# Patient Record
Sex: Female | Born: 1985 | Race: White | Hispanic: No | Marital: Single
Health system: Southern US, Community
[De-identification: ages and names within clinical notes are randomized; demographics above are authoritative.]

## PROBLEM LIST (undated history)

## (undated) DIAGNOSIS — G8929 Other chronic pain: Secondary | ICD-10-CM

## (undated) DIAGNOSIS — N200 Calculus of kidney: Secondary | ICD-10-CM

## (undated) DIAGNOSIS — M6208 Separation of muscle (nontraumatic), other site: Secondary | ICD-10-CM

## (undated) HISTORY — PX: FRACTURE SURGERY: SHX138

## (undated) HISTORY — PX: OTHER SURGICAL HISTORY: SHX169

## (undated) HISTORY — PX: PLACEMENT OF BREAST IMPLANTS: SHX6334

---

## 2014-04-29 ENCOUNTER — Other Ambulatory Visit: Payer: Self-pay | Admitting: Chiropractic Medicine

## 2014-04-29 DIAGNOSIS — G9520 Unspecified cord compression: Secondary | ICD-10-CM

## 2014-05-01 ENCOUNTER — Ambulatory Visit
Admission: RE | Admit: 2014-05-01 | Discharge: 2014-05-01 | Disposition: A | Discharge status: Home / Self Care | Payer: No Typology Code available for payment source | Source: Ambulatory Visit | Attending: Chiropractic Medicine | Admitting: Chiropractic Medicine

## 2014-05-01 DIAGNOSIS — G9520 Unspecified cord compression: Secondary | ICD-10-CM

## 2014-05-05 ENCOUNTER — Other Ambulatory Visit: Payer: Self-pay

## 2018-04-26 ENCOUNTER — Other Ambulatory Visit: Payer: Self-pay | Admitting: Family Medicine

## 2018-04-27 ENCOUNTER — Other Ambulatory Visit: Payer: Self-pay | Admitting: Family Medicine

## 2018-04-27 DIAGNOSIS — M25562 Pain in left knee: Secondary | ICD-10-CM

## 2018-04-28 ENCOUNTER — Ambulatory Visit
Admission: RE | Admit: 2018-04-28 | Discharge: 2018-04-28 | Disposition: A | Discharge status: Home / Self Care | Payer: No Typology Code available for payment source | Source: Ambulatory Visit | Attending: Family Medicine | Admitting: Family Medicine

## 2018-04-28 DIAGNOSIS — M25562 Pain in left knee: Secondary | ICD-10-CM

## 2020-11-19 ENCOUNTER — Other Ambulatory Visit: Payer: Self-pay

## 2020-11-19 ENCOUNTER — Emergency Department (HOSPITAL_BASED_OUTPATIENT_CLINIC_OR_DEPARTMENT_OTHER): Payer: 59

## 2020-11-19 ENCOUNTER — Inpatient Hospital Stay (HOSPITAL_BASED_OUTPATIENT_CLINIC_OR_DEPARTMENT_OTHER)
Admission: EM | Admit: 2020-11-19 | Discharge: 2020-11-21 | DRG: 390 | Disposition: A | Discharge status: Home / Self Care | Payer: 59 | Attending: Internal Medicine | Admitting: Internal Medicine

## 2020-11-19 ENCOUNTER — Encounter (HOSPITAL_BASED_OUTPATIENT_CLINIC_OR_DEPARTMENT_OTHER): Payer: Self-pay | Admitting: *Deleted

## 2020-11-19 DIAGNOSIS — R109 Unspecified abdominal pain: Secondary | ICD-10-CM | POA: Diagnosis present

## 2020-11-19 DIAGNOSIS — K561 Intussusception: Secondary | ICD-10-CM | POA: Diagnosis present

## 2020-11-19 DIAGNOSIS — Z20822 Contact with and (suspected) exposure to covid-19: Secondary | ICD-10-CM | POA: Diagnosis present

## 2020-11-19 DIAGNOSIS — Z881 Allergy status to other antibiotic agents status: Secondary | ICD-10-CM | POA: Diagnosis not present

## 2020-11-19 DIAGNOSIS — R112 Nausea with vomiting, unspecified: Secondary | ICD-10-CM | POA: Diagnosis not present

## 2020-11-19 DIAGNOSIS — R1084 Generalized abdominal pain: Secondary | ICD-10-CM

## 2020-11-19 DIAGNOSIS — Z975 Presence of (intrauterine) contraceptive device: Secondary | ICD-10-CM

## 2020-11-19 DIAGNOSIS — Z9882 Breast implant status: Secondary | ICD-10-CM | POA: Diagnosis not present

## 2020-11-19 DIAGNOSIS — M545 Low back pain, unspecified: Secondary | ICD-10-CM

## 2020-11-19 DIAGNOSIS — D6959 Other secondary thrombocytopenia: Secondary | ICD-10-CM | POA: Diagnosis present

## 2020-11-19 DIAGNOSIS — D72818 Other decreased white blood cell count: Secondary | ICD-10-CM | POA: Diagnosis present

## 2020-11-19 DIAGNOSIS — M549 Dorsalgia, unspecified: Secondary | ICD-10-CM | POA: Diagnosis present

## 2020-11-19 DIAGNOSIS — G8929 Other chronic pain: Secondary | ICD-10-CM | POA: Diagnosis present

## 2020-11-19 DIAGNOSIS — Z800 Family history of malignant neoplasm of digestive organs: Secondary | ICD-10-CM | POA: Diagnosis not present

## 2020-11-19 HISTORY — DX: Other chronic pain: G89.29

## 2020-11-19 HISTORY — DX: Calculus of kidney: N20.0

## 2020-11-19 LAB — CBC WITH DIFFERENTIAL/PLATELET
Abs Immature Granulocytes: 0.01 10*3/uL (ref 0.00–0.07)
Basophils Absolute: 0 10*3/uL (ref 0.0–0.1)
Basophils Relative: 0 %
Eosinophils Absolute: 0 10*3/uL (ref 0.0–0.5)
Eosinophils Relative: 0 %
HCT: 42.7 % (ref 36.0–46.0)
Hemoglobin: 14.2 g/dL (ref 12.0–15.0)
Immature Granulocytes: 0 %
Lymphocytes Relative: 6 %
Lymphs Abs: 0.3 10*3/uL — ABNORMAL LOW (ref 0.7–4.0)
MCH: 30.9 pg (ref 26.0–34.0)
MCHC: 33.3 g/dL (ref 30.0–36.0)
MCV: 92.8 fL (ref 80.0–100.0)
Monocytes Absolute: 0.2 10*3/uL (ref 0.1–1.0)
Monocytes Relative: 4 %
Neutro Abs: 4.7 10*3/uL (ref 1.7–7.7)
Neutrophils Relative %: 90 %
Platelets: 140 10*3/uL — ABNORMAL LOW (ref 150–400)
RBC: 4.6 MIL/uL (ref 3.87–5.11)
RDW: 11.8 % (ref 11.5–15.5)
WBC: 5.3 10*3/uL (ref 4.0–10.5)
nRBC: 0 % (ref 0.0–0.2)

## 2020-11-19 LAB — RESP PANEL BY RT-PCR (FLU A&B, COVID) ARPGX2
Influenza A by PCR: NEGATIVE
Influenza B by PCR: NEGATIVE
SARS Coronavirus 2 by RT PCR: NEGATIVE

## 2020-11-19 LAB — COMPREHENSIVE METABOLIC PANEL
ALT: 20 U/L (ref 0–44)
AST: 20 U/L (ref 15–41)
Albumin: 4.6 g/dL (ref 3.5–5.0)
Alkaline Phosphatase: 52 U/L (ref 38–126)
Anion gap: 6 (ref 5–15)
BUN: 18 mg/dL (ref 6–20)
CO2: 28 mmol/L (ref 22–32)
Calcium: 9 mg/dL (ref 8.9–10.3)
Chloride: 105 mmol/L (ref 98–111)
Creatinine, Ser: 0.96 mg/dL (ref 0.44–1.00)
GFR, Estimated: >60 mL/min (ref >60)
Glucose, Bld: 99 mg/dL (ref 70–99)
Potassium: 4.1 mmol/L (ref 3.5–5.1)
Sodium: 139 mmol/L (ref 135–145)
Total Bilirubin: 0.8 mg/dL (ref 0.3–1.2)
Total Protein: 7.2 g/dL (ref 6.5–8.1)

## 2020-11-19 LAB — URINALYSIS, ROUTINE W REFLEX MICROSCOPIC
Bilirubin Urine: NEGATIVE
Glucose, UA: NEGATIVE mg/dL
Hgb urine dipstick: NEGATIVE
Ketones, ur: NEGATIVE mg/dL
Leukocytes,Ua: NEGATIVE
Nitrite: NEGATIVE
Protein, ur: NEGATIVE mg/dL
Specific Gravity, Urine: 1.02 (ref 1.005–1.030)
pH: 7.5 (ref 5.0–8.0)

## 2020-11-19 LAB — LIPASE, BLOOD: Lipase: 28 U/L (ref 11–51)

## 2020-11-19 LAB — LACTIC ACID, PLASMA: Lactic Acid, Venous: 0.6 mmol/L (ref 0.5–1.9)

## 2020-11-19 LAB — PREGNANCY, URINE: Preg Test, Ur: NEGATIVE

## 2020-11-19 MED ORDER — IOHEXOL 350 MG/ML SOLN
85.0000 mL | Freq: Once | INTRAVENOUS | Status: AC | PRN
  Administered 2020-11-19: 85 mL via INTRAVENOUS

## 2020-11-19 MED ORDER — FAMOTIDINE IN NACL 20-0.9 MG/50ML-% IV SOLN
20.0000 mg | Freq: Once | INTRAVENOUS | Status: AC
  Administered 2020-11-19: 20 mg via INTRAVENOUS
  Filled 2020-11-19: qty 50

## 2020-11-19 MED ORDER — SODIUM CHLORIDE 0.9 % IV BOLUS
1000.0000 mL | Freq: Once | INTRAVENOUS | Status: AC
  Administered 2020-11-19: 1000 mL via INTRAVENOUS

## 2020-11-19 MED ORDER — HYDROMORPHONE HCL 1 MG/ML IJ SOLN
0.5000 mg | INTRAMUSCULAR | Status: DC | PRN
  Administered 2020-11-19: 0.5 mg via INTRAVENOUS
  Filled 2020-11-19 (×3): qty 0.5

## 2020-11-19 MED ORDER — ONDANSETRON HCL 4 MG/2ML IJ SOLN: 4.0000 mg | Freq: Four times a day (QID) | INTRAMUSCULAR | Status: DC | PRN

## 2020-11-19 MED ORDER — ACETAMINOPHEN 650 MG RE SUPP: 650.0000 mg | Freq: Four times a day (QID) | RECTAL | Status: DC | PRN

## 2020-11-19 MED ORDER — ONDANSETRON HCL 4 MG/2ML IJ SOLN
4.0000 mg | Freq: Once | INTRAMUSCULAR | Status: AC
  Administered 2020-11-19: 4 mg via INTRAVENOUS
  Filled 2020-11-19: qty 2

## 2020-11-19 MED ORDER — FENTANYL CITRATE PF 50 MCG/ML IJ SOSY
25.0000 ug | PREFILLED_SYRINGE | Freq: Once | INTRAMUSCULAR | Status: AC
  Administered 2020-11-19: 25 ug via INTRAVENOUS
  Filled 2020-11-19: qty 1

## 2020-11-19 MED ORDER — NALOXONE HCL 0.4 MG/ML IJ SOLN: 0.4000 mg | INTRAMUSCULAR | Status: DC | PRN

## 2020-11-19 MED ORDER — LACTATED RINGERS IV SOLN: INTRAVENOUS | Status: DC

## 2020-11-19 MED ORDER — ACETAMINOPHEN 325 MG PO TABS: 650.0000 mg | ORAL_TABLET | Freq: Four times a day (QID) | ORAL | Status: DC | PRN

## 2020-11-19 NOTE — ED Provider Notes (Signed)
[facility] EMERGENCY DEPARTMENT Provider Note   CSN: [id number] Arrival date & time: 11/19/20  1613     History Chief Complaint  Patient presents with   Back Pain    Sheila Mcclain is a 35 y.o. female presenting to the ED with a chief complaint of back pain, abdominal pain and vomiting.  States that she has a history of chronic back pain and began having lower back pain last night which she attributed to her chronic pain.  When she woke up this morning and was brushing her teeth, she began feeling nauseous and had an episode of nonbloody, nonbilious emesis.  She took 1 dose of Robaxin to help with her abdominal pain which turned into back pain.  This helped briefly but continued to be symptomatic.  She has not had a bowel movement today.  She has had no dysuria, hematuria.  No vaginal or pelvic complaints.  She has a history of kidney stones with last episode being in 2016-2017.  This passed without difficulty.  She was told at the time that she had kidney stones that may cause pain in the future.  She is concerned that this could be related to kidney stones.  Has recently finished antibiotics for BV.  She does not believe that she could be pregnant as she has an IUD. No prior abdominal surgeries.  HPI     Past Medical History:  Diagnosis Date   Chronic back pain    Kidney stones     There are no problems to display for this patient.   Past Surgical History:  Procedure Laterality Date   FRACTURE SURGERY     PLACEMENT OF BREAST IMPLANTS       OB History   No obstetric history on file.     No family history on file.  Social History   Tobacco Use   Smoking status: Never   Smokeless tobacco: Never  Vaping Use   Vaping Use: Never used  Substance Use Topics   Alcohol use: Yes   Drug use: Never    Home Medications Prior to Admission medications   Medication Sig Start Date End Date Taking? Authorizing Provider  methocarbamol (ROBAXIN) 500 MG tablet   04/27/16  Yes [provider]    Allergies    Clindamycin  Review of Systems   Review of Systems  Constitutional:  Negative for appetite change, chills and fever.  HENT:  Negative for ear pain, rhinorrhea, sneezing and sore throat.   Eyes:  Negative for photophobia and visual disturbance.  Respiratory:  Negative for cough, chest tightness, shortness of breath and wheezing.   Cardiovascular:  Negative for chest pain and palpitations.  Gastrointestinal:  Positive for abdominal pain, nausea and vomiting. Negative for blood in stool, constipation and diarrhea.  Genitourinary:  Negative for dysuria, hematuria and urgency.  Musculoskeletal:  Positive for back pain. Negative for myalgias.  Skin:  Negative for rash.  Neurological:  Negative for dizziness, weakness and light-headedness.   Physical Exam Updated Vital Signs BP 113/61   Pulse 93   Temp 99.6 F (37.6 C) (Oral)   Resp 16   Ht 5\' 8"  (1.727 m)   Wt 70.8 kg   LMP 11/12/2020   SpO2 99%   BMI 23.72 kg/m   Physical Exam Vitals and nursing note reviewed.  Constitutional:      General: She is not in acute distress.    Appearance: She is well-developed.  HENT:     Head: Normocephalic  and atraumatic.     Nose: Nose normal.  Eyes:     General: No scleral icterus.       Left eye: No discharge.     Conjunctiva/sclera: Conjunctivae normal.  Cardiovascular:     Rate and Rhythm: Regular rhythm. Tachycardia present.     Heart sounds: Normal heart sounds. No murmur heard.   No friction rub. No gallop.  Pulmonary:     Effort: Pulmonary effort is normal. No respiratory distress.     Breath sounds: Normal breath sounds.  Abdominal:     General: Bowel sounds are normal. There is no distension.     Palpations: Abdomen is soft.     Tenderness: There is abdominal tenderness (Generalized). There is right CVA tenderness and left CVA tenderness. There is no guarding.  Musculoskeletal:        General: Normal range of motion.      Cervical back: Normal range of motion and neck supple.  Skin:    General: Skin is warm and dry.     Findings: No rash.  Neurological:     Mental Status: She is alert.     Motor: No abnormal muscle tone.     Coordination: Coordination normal.    ED Results / Procedures / Treatments   Labs (all labs ordered are listed, but only abnormal results are displayed) Labs Reviewed  CBC WITH DIFFERENTIAL/PLATELET - Abnormal; Notable for the following components:      Result Value   Platelets 140 (*)    Lymphs Abs 0.3 (*)    All other components within normal limits  RESP PANEL BY RT-PCR (FLU A&B, COVID) ARPGX2  COMPREHENSIVE METABOLIC PANEL  URINALYSIS, ROUTINE W REFLEX MICROSCOPIC  PREGNANCY, URINE  LIPASE, BLOOD  LACTIC ACID, PLASMA    EKG None  Radiology CT ABDOMEN PELVIS W CONTRAST  Result Date: 11/19/2020 CLINICAL DATA:  Epigastric pain. Chronic back pain. feels like the pain may be related to a kidney stone EXAM: CT ABDOMEN AND PELVIS WITH CONTRAST TECHNIQUE: Multidetector CT imaging of the abdomen and pelvis was performed using the standard protocol following bolus administration of intravenous contrast. CONTRAST:  33mL OMNIPAQUE IOHEXOL 350 MG/ML SOLN COMPARISON:  None. FINDINGS: Lower chest: Bilateral breast implants partially visualized. No acute abnormality. Hepatobiliary: No focal liver abnormality. No gallstones, gallbladder wall thickening, or pericholecystic fluid. No biliary dilatation. Pancreas: No focal lesion. Normal pancreatic contour. No surrounding inflammatory changes. No main pancreatic ductal dilatation. Spleen: Normal in size without focal abnormality. Adrenals/Urinary Tract: No adrenal nodule bilaterally. Bilateral kidneys enhance symmetrically. Subcentimeter hypodensity within the right kidney is too small to characterize. No nephroureterolithiasis. No nephroureterolithiasis.  No hydronephrosis. No hydroureter. The urinary bladder is unremarkable. Stomach/Bowel:  Stomach is within normal limits. Left upper abdomen intussusception of the proximal small bowel telescoping into the small bowel and measuring approximately 5.5 cm in length (2:41, 5:56). Otherwise no small bowel wall thickening or dilatation. No evidence of large bowel wall thickening or dilatation. The appendix not definitely identified with no right lower quadrant inflammatory changes to suggest acute appendicitis. Vascular/Lymphatic: No abdominal aorta or iliac aneurysm. No abdominal, pelvic, or inguinal lymphadenopathy. Reproductive: A t shaped intrauterine device is noted within the uterus in grossly appropriate position. Otherwise the uterus and bilateral adnexal regions are unremarkable. Other: No intraperitoneal free fluid. No intraperitoneal free gas. No organized fluid collection. Musculoskeletal: No abdominal wall hernia or abnormality. No suspicious lytic or blastic osseous lesions. No acute displaced fracture. Endplate sclerosis L4-L5. IMPRESSION: 1. Left upper  abdomen proximal small bowel into small bowel intussusception measuring approximately 5.5 cm in length. 2. T-shaped intrauterine device in grossly appropriate position. 3. No nephroureterolithiasis. Electronically Signed   By: Tish Frederickson M.D.   On: 11/19/2020 18:21    Procedures Procedures   Medications Ordered in ED Medications  sodium chloride 0.9 % bolus 1,000 mL (0 mLs Intravenous Stopped 11/19/20 1845)  famotidine (PEPCID) IVPB 20 mg premix (0 mg Intravenous Stopped 11/19/20 1749)  ondansetron (ZOFRAN) injection 4 mg (4 mg Intravenous Given 11/19/20 1716)  fentaNYL (SUBLIMAZE) injection 25 mcg (25 mcg Intravenous Given 11/19/20 1719)  iohexol (OMNIPAQUE) 350 MG/ML injection 85 mL (85 mLs Intravenous Contrast Given 11/19/20 1737)  fentaNYL (SUBLIMAZE) injection 25 mcg (25 mcg Intravenous Given 11/19/20 2037)    ED Course  I have reviewed the triage vital signs and the nursing notes.  Pertinent labs & imaging results that were  available during my care of the patient were reviewed by me and considered in my medical decision making (see chart for details).  Clinical Course as of 11/19/20 2102  Thu Nov 19, 2020  1702 Preg Test, Ur: NEGATIVE [HK]  1702 Urinalysis, Routine w reflex microscopic Urine, Clean Catch [HK]  1707 WBC: 5.3 [HK]  1710 Lipase: 28 [HK]  1710 Creatinine: 0.96 [HK]  1807 Patient initially agreed to be admitted to [facility] long.  However states that she would prefer to be transferred to [facility] as she has received previous care there.  We will attempt to transfer and admit there after speaking to their surgery team. [HK]  2016 Lactic Acid, Venous: 0.6 [HK]  2016 No beds available at [facility].  She agrees to admission at [facility].  I admitted to medicine service with general surgery consult. [HK]    Clinical Course User Index [HK] Dietrich Pates, PA-C   MDM Rules/Calculators/A&P                           35 year old female presenting to the ED for abdominal pain, lower back pain and vomiting.  Symptoms began yesterday with back pain but progressed to abdominal pain vomiting today.  Minimal improvement noted with Robaxin.  Reports pain is mostly in her upper abdomen but does report generalized pain as well.  Denies any pelvic or vaginal complaints.  Reports decreased urine and no bowel movement today.  Unsure if she has been passing gas.  On exam patient with tenderness generally with bilateral CVA tenderness.  Urinalysis here is negative.  Pregnancy test is negative.  CBC, CMP unremarkable.  Lipase is normal here.  CT of the abdomen pelvis shows left upper small bowel intussusception.  Lactic acid level is normal.  After consultation with general surgery, Dr. Carolynne Edouard recommends overnight observation for symptom control.  Patient will be transferred to [facility] for admission under hospitalist service.  Her pain is controlled here.    Portions of this note were generated with [employer]. Dictation errors may occur despite best attempts at proofreading.  Final Clinical Impression(s) / ED Diagnoses Final diagnoses:  Intussusception [facility])    Rx / DC Orders ED Discharge Orders     None        Dietrich Pates, PA-C 11/19/20 2102    Maia Plan, MD 11/20/20 1223

## 2020-11-19 NOTE — Plan of Care (Signed)
[facility] will assume care on arrival to accepting facility. Until arrival, care as per EDP. However, [facility] available 24/7 for questions and assistance.  Nursing staff please page [facility] Admits and Consults ([phone]) as soon as the patient arrives the hospital.   

## 2020-11-19 NOTE — ED Notes (Signed)
ED Provider at bedside. 

## 2020-11-19 NOTE — ED Triage Notes (Signed)
Hx of chronic back pain. Lower back pain across her back since yesterday. She feels like the pain may be related to a kidney stone.

## 2020-11-19 NOTE — ED Notes (Signed)
Report called to RN [facility] @ [facility].

## 2020-11-19 NOTE — H&P (Signed)
History and Physical    PLEASE NOTE THAT DRAGON DICTATION SOFTWARE WAS USED IN THE CONSTRUCTION OF THIS NOTE.   Sheila Mcclain ZOX:[id number] DOB: [birth year] DOA: 11/19/2020  PCP: Pcp, No Patient coming from: home/[facility]   I have personally briefly reviewed patient's old medical records in [facility]  Chief Complaint: Abdominal pain  HPI: Sheila Mcclain is a 35 y.o. female with medical history significant for chronic low back pain, kidney stones who is [facility] 11/19/2020 [facility] with proximal small bowel intussusception after presenting from home to the latter facility complaining of abdominal pain.  The patient reports 1 day of abdominal discomfort, noting onset on the evening of 11/18/2020.  Over that time, she conveys sharp abdominal discomfort that is diffuse in distribution but most prominent over the bilateral upper abdominal quadrants, left greater than right.  While initially this discomfort was intermittent, she notes that starting this morning, the pain became constant, worse with palpation over the abdomen.  She has never previously experienced similar abdominal discomfort.  She also notes associated intermittent nausea, resulting in 1 episode of nonbloody, nonbilious emesis that occurred on the morning of 11/19/2020.  Most recent bowel movement occurred on 11/18/2020, noting that the absence of bowel movement over the last 1 day is atypical for her.  Denies any associated or recent melena or hematochezia.  She also notes bilateral low back pain, but conveys that this is chronic in nature, without any significant recent exacerbation thereof.  She specifically conveys her believe that the low back discomfort that she is currently experiencing is separate from her presenting abdominal pain, and denies any associated overt radiation.  Denies any recent trauma or travel.  Denies any associated subjective fever, chills, rigors, or  generalized myalgias.  No recent headache, neck stiffness, shortness of breath, wheezing, cough, diarrhea, or rash.  No recent known COVID-19 exposures.  She denies any acute urinary symptoms, including no dysuria, gross hematuria, or change in urinary urgency/frequency, although she reports that she was recently diagnosed with bacterial vaginosis and underwent biotic treatment for this.  Denies any associated chest pain, palpitations, diaphoresis, dizziness, presyncope, or syncope.  In the setting of intermittent nausea over the course the last day, the patient acknowledges a relative decline in her oral intake over that timeframe.  She reports that she has an IUD in place, but denies any history of laparoscopic or open abdominal surgeries.  No history of bowel obstruction.  She also conveys a history of at least 2 prior kidney stones, with most recent stone occurring 5 to 6 years ago.  She notes that she was able to spontaneously pass her prior kidney stones in the absence of associated need for stent placement or lithotripsy.    [facility]:  Vital signs in the ED were notable for the following: Demonstrates 99.9; heart rate initially 10 8-1 09, which decreased to 98-96 following interval IV fluid administration, as further detailed below; blood pressure 106/63 -120/76; oxygen saturation 95 to 100% room air.   Labs were notable for the following: CMP notable for the following: Bicarbonate 20, creatinine 0.96, glucose 99, and liver enzymes were found to be within normal limits.  Urine pregnancy test negative.  CBC notable for white cell count 5300.  Lactic acid 0.6.  Urinalysis showed no white blood cells, leukocyte Estrace negative, and nitrate negative.  Screening COVID-19 PCR performed in the ED this evening was found to be  negative.  Imaging and additional notable ED work-up: CT abdomen/pelvis with contrast showed left upper abdominal proximal small bowel into small bowel intussusception  measuring approximately 5.5 cm in length, but otherwise showed no evidence of small bowel wall thickening or dilation.  No evidence of bowel perforation or abscess. Additionally, CT abdomen/pelvis showed no evidence of large bowel obstruction or nephro ureterolithiasis, and otherwise showed no evidence of acute intra-abdominal process.  While in the ED, the following were administered: Fentanyl 25 mcg IV x2, Zofran 4 mg IV x1, Pepcid 20 mg IV x1, and a 1 L normal saline bolus.  EDP discussed the patient's case and imaging with the on-call general surgeon, Dr. Carolynne Edouard, who recommended admission to the hospitalist service at [facility] for further management of presenting small bowel intussusception, including overnight symptomatic management, with gen surg to formally consult.      Review of Systems: As per HPI otherwise 10 point review of systems negative.   Past Medical History:  Diagnosis Date   Chronic back pain    Kidney stones     Past Surgical History:  Procedure Laterality Date   FRACTURE SURGERY     PLACEMENT OF BREAST IMPLANTS      Social History:  reports that she has never smoked. She has never used smokeless tobacco. She reports current alcohol use. She reports that she does not use drugs.   Allergies  Allergen Reactions   Clindamycin Diarrhea and Other (See Comments)    Thrombocytopenia Thrombocytopenia     Family history reviewed and not pertinent    Prior to Admission medications   Medication Sig Start Date End Date Taking? Authorizing Provider  methocarbamol (ROBAXIN) 500 MG tablet  04/27/16  Yes [provider]     Objective    Physical Exam: Vitals:   11/19/20 1915 11/19/20 2000 11/19/20 2123 11/19/20 2244  BP:  113/61 119/83 123/74  Pulse: 93 93 88 82  Resp: Temp:   99.9 F (37.7 C) 99.1 F (37.3 C)  TempSrc:   Oral   SpO2: 95% 99% 98% 98%  Weight:      Height:        General: appears to be stated age; alert, oriented Skin:  warm, dry, no rash Head:  AT/[place] Mouth:  Oral mucosa membranes appear dry, normal dentition Neck: supple; trachea midline Heart:  RRR; did not appreciate any M/R/G Lungs: CTAB, did not appreciate any wheezes, rales, or rhonchi Abdomen: + BS; soft, ND, mild diffuse tenderness, most prominent over the bilateral upper abdominal quadrants, but in the absence of any associated guarding, rigidity, or rebound tenderness. Vascular: 2+ pedal pulses b/l; 2+ radial pulses b/l Extremities: no peripheral edema, no muscle wasting Neuro: strength and sensation intact in upper and lower extremities b/l    Labs on Admission: I have personally reviewed following labs and imaging studies  CBC: Recent Labs  Lab 11/19/20 1648  WBC 5.3  NEUTROABS 4.7  HGB 14.2  HCT 42.7  MCV 92.8  PLT 140*   Basic Metabolic Panel: Recent Labs  Lab 11/19/20 1648  NA 139  K 4.1  CL 105  CO2 28  GLUCOSE 99  BUN 18  CREATININE 0.96  CALCIUM 9.0   GFR: Estimated Creatinine Clearance: 84.1 mL/min (by C-G formula based on SCr of 0.96 mg/dL). Liver Function Tests: Recent Labs  Lab 11/19/20 1648  AST 20  ALT 20  ALKPHOS 52  BILITOT 0.8  PROT 7.2  ALBUMIN 4.6  Recent Labs  Lab 11/19/20 1648  LIPASE 28   No results for input(s): AMMONIA in the last 168 hours. Coagulation Profile: No results for input(s): INR, PROTIME in the last 168 hours. Cardiac Enzymes: No results for input(s): CKTOTAL, CKMB, CKMBINDEX, TROPONINI in the last 168 hours. BNP (last 3 results) No results for input(s): PROBNP in the last 8760 hours. HbA1C: No results for input(s): HGBA1C in the last 72 hours. CBG: No results for input(s): GLUCAP in the last 168 hours. Lipid Profile: No results for input(s): CHOL, HDL, LDLCALC, TRIG, CHOLHDL, LDLDIRECT in the last 72 hours. Thyroid Function Tests: No results for input(s): TSH, T4TOTAL, FREET4, T3FREE, THYROIDAB in the last 72 hours. Anemia Panel: No results for input(s):  VITAMINB12, FOLATE, FERRITIN, TIBC, IRON, RETICCTPCT in the last 72 hours. Urine analysis:    Component Value Date/Time   COLORURINE YELLOW 11/19/2020 1648   APPEARANCEUR CLEAR 11/19/2020 1648   LABSPEC 1.020 11/19/2020 1648   PHURINE 7.5 11/19/2020 1648   GLUCOSEU NEGATIVE 11/19/2020 1648   HGBUR NEGATIVE 11/19/2020 1648   BILIRUBINUR NEGATIVE 11/19/2020 1648   KETONESUR NEGATIVE 11/19/2020 1648   PROTEINUR NEGATIVE 11/19/2020 1648   NITRITE NEGATIVE 11/19/2020 1648   LEUKOCYTESUR NEGATIVE 11/19/2020 1648    Radiological Exams on Admission: CT ABDOMEN PELVIS W CONTRAST  Result Date: 11/19/2020 CLINICAL DATA:  Epigastric pain. Chronic back pain. feels like the pain may be related to a kidney stone EXAM: CT ABDOMEN AND PELVIS WITH CONTRAST TECHNIQUE: Multidetector CT imaging of the abdomen and pelvis was performed using the standard protocol following bolus administration of intravenous contrast. CONTRAST:  71mL OMNIPAQUE IOHEXOL 350 MG/ML SOLN COMPARISON:  None. FINDINGS: Lower chest: Bilateral breast implants partially visualized. No acute abnormality. Hepatobiliary: No focal liver abnormality. No gallstones, gallbladder wall thickening, or pericholecystic fluid. No biliary dilatation. Pancreas: No focal lesion. Normal pancreatic contour. No surrounding inflammatory changes. No main pancreatic ductal dilatation. Spleen: Normal in size without focal abnormality. Adrenals/Urinary Tract: No adrenal nodule bilaterally. Bilateral kidneys enhance symmetrically. Subcentimeter hypodensity within the right kidney is too small to characterize. No nephroureterolithiasis. No nephroureterolithiasis.  No hydronephrosis. No hydroureter. The urinary bladder is unremarkable. Stomach/Bowel: Stomach is within normal limits. Left upper abdomen intussusception of the proximal small bowel telescoping into the small bowel and measuring approximately 5.5 cm in length (2:41, 5:56). Otherwise no small bowel wall  thickening or dilatation. No evidence of large bowel wall thickening or dilatation. The appendix not definitely identified with no right lower quadrant inflammatory changes to suggest acute appendicitis. Vascular/Lymphatic: No abdominal aorta or iliac aneurysm. No abdominal, pelvic, or inguinal lymphadenopathy. Reproductive: A t shaped intrauterine device is noted within the uterus in grossly appropriate position. Otherwise the uterus and bilateral adnexal regions are unremarkable. Other: No intraperitoneal free fluid. No intraperitoneal free gas. No organized fluid collection. Musculoskeletal: No abdominal wall hernia or abnormality. No suspicious lytic or blastic osseous lesions. No acute displaced fracture. Endplate sclerosis L4-L5. IMPRESSION: 1. Left upper abdomen proximal small bowel into small bowel intussusception measuring approximately 5.5 cm in length. 2. T-shaped intrauterine device in grossly appropriate position. 3. No nephroureterolithiasis. Electronically Signed   By: Tish Frederickson M.D.   On: 11/19/2020 18:21      Assessment/Plan   Sheila Mcclain is a 35 y.o. female with medical history significant for chronic low back pain, kidney stones who is admitted to [facility] on 11/19/2020 by way of transfer from  [facility] with proximal small bowel intussusception after presenting from home to  the latter facility complaining of abdominal pain.   Principal Problem:   Intussusception (HCC) Active Problems:   Abdominal pain   Nausea & vomiting   Chronic back pain     #) Proximal small bowel intussusception: Diagnosis on the basis of 1 day of diffuse abdominal discomfort, most prominent in the upper abdominal quadrants associated with nausea/vomiting, absence of bowel movement over the course of the last day, and ensuing CT abdomen/pelvis demonstrating evidence of left upper abdominal proximal small bowel to small bowel intussusception without evidence of abscess or perforation or  any other acute intra-abdominal process.  EDP discussed the patient's case and imaging with the on-call general surgeon, Dr. Carolynne Edouard, who recommended admission to the hospitalist service for further management of intussusception, including overnight symptomatic management, with plan for gen surg to formally consult.  Physical exam reveals no evidence of an acute surgical abdomen in the absence of any associated peritoneal signs.  appears nonseptic at this time, and lactate not elevated. Will keep n.p.o. overnight, while pursuing symptomatic management of presenting abdominal discomfort as well as nausea/vomiting, as further detailed below.  Plan: NPO.  Continuous lactated Ringer's at 125 cc/h.  As needed IV Dilaudid.  As needed IV Zofran.  Refraining from pharmacologic DVT prophylaxis.  SCDs.  General surgery consulted, as above.  Repeat CMP and CBC in the morning.  Check INR and serum magnesium level.       #) Abdominal discomfort: 1 day of diffuse abdominal discomfort most prominent in the bilateral upper abdominal quadrants, which appears to be the basis of proximal small bowel intussusception, as further detailed above.  Otherwise, no evidence of acute intra-abdominal process on CT abdomen/pelvis, including no evidence of bowel perforation or abscess. No evidence of peritoneal signs on physical exam.  UA was inconsistent with UTI.   Plan: Further evaluation and management of presenting proximal small bowel intussusception, as above, including.  IV Dilaudid, n.p.o., and general surgery consultation.       #) Nausea/vomiting: Intermittent nausea over the course of the last day, resulting in 1 episode of nonbloody, nonbilious emesis that occurred on the morning of 11/19/2020.  It appears related to her presenting small bowel intussusception, as detailed above.  Urine pregnancy test negative.  No evidence of UTI.  Presenting blood sugar not elevated, with additional labs reflecting no evidence of anion  gap metabolic acidosis.  Plan: Further management presenting small bowel intussusception, as above.  NPO.  Continuous IV fluids, as above.  As needed IV Zofran.  Check serum magnesium level.  Repeat CMP and CBC in the morning.  General surgery consultation is competitive management of small bowel intussusception, as above.       #) Chronic back pain: The patient conveys a history of chronic low back pain, noting the presence of such over the course of the last few days, in similar distribution and intensity relative to her baseline back discomfort.  No preceding trauma.  Lower extremities appear neurovascularly intact, and no evidence of red flag symptoms at this time.  She is on as needed Robaxin as an outpatient.  Plan: Hold as needed naproxen for now in setting of current n.p.o. status.  As needed IV Dilaudid, suspect component of management of presenting small bowel intussusception, as above.  Repeat CBC in the morning.    DVT prophylaxis: scd's  Code Status: Full code Family Communication: none Disposition Plan: Per Rounding Team Consults called: case/imaging discussed with on-call gen surg, Dr. Carolynne Edouard, as further detailed above;  Admission status: Observation; MedSurg     Of note, this patient was added by me to the following Admit List/Treatment Team: [email or web address].      PLEASE NOTE THAT DRAGON DICTATION SOFTWARE WAS USED IN THE CONSTRUCTION OF THIS NOTE.   Angie Fava DO [facility] Pager [phone] From 6PM - 2AM  Otherwise, please contact night-coverage  [email or web address] Password [email or web address]   11/19/2020, 11:14 PM

## 2020-11-20 ENCOUNTER — Inpatient Hospital Stay (HOSPITAL_COMMUNITY): Payer: 59

## 2020-11-20 ENCOUNTER — Encounter (HOSPITAL_COMMUNITY): Payer: Self-pay | Admitting: Internal Medicine

## 2020-11-20 DIAGNOSIS — K561 Intussusception: Principal | ICD-10-CM

## 2020-11-20 LAB — COMPREHENSIVE METABOLIC PANEL
ALT: 15 U/L (ref 0–44)
AST: 14 U/L — ABNORMAL LOW (ref 15–41)
Albumin: 3.6 g/dL (ref 3.5–5.0)
Alkaline Phosphatase: 41 U/L (ref 38–126)
Anion gap: 7 (ref 5–15)
BUN: 13 mg/dL (ref 6–20)
CO2: 26 mmol/L (ref 22–32)
Calcium: 8.4 mg/dL — ABNORMAL LOW (ref 8.9–10.3)
Chloride: 104 mmol/L (ref 98–111)
Creatinine, Ser: 0.93 mg/dL (ref 0.44–1.00)
GFR, Estimated: >60 mL/min (ref >60)
Glucose, Bld: 95 mg/dL (ref 70–99)
Potassium: 3.6 mmol/L (ref 3.5–5.1)
Sodium: 137 mmol/L (ref 135–145)
Total Bilirubin: 0.9 mg/dL (ref 0.3–1.2)
Total Protein: 5.6 g/dL — ABNORMAL LOW (ref 6.5–8.1)

## 2020-11-20 LAB — HIV ANTIBODY (ROUTINE TESTING W REFLEX): HIV Screen 4th Generation wRfx: NONREACTIVE

## 2020-11-20 LAB — CBC
HCT: 35.3 % — ABNORMAL LOW (ref 36.0–46.0)
Hemoglobin: 11.6 g/dL — ABNORMAL LOW (ref 12.0–15.0)
MCH: 30.4 pg (ref 26.0–34.0)
MCHC: 32.9 g/dL (ref 30.0–36.0)
MCV: 92.7 fL (ref 80.0–100.0)
Platelets: 116 10*3/uL — ABNORMAL LOW (ref 150–400)
RBC: 3.81 MIL/uL — ABNORMAL LOW (ref 3.87–5.11)
RDW: 11.9 % (ref 11.5–15.5)
WBC: 3.2 10*3/uL — ABNORMAL LOW (ref 4.0–10.5)
nRBC: 0 % (ref 0.0–0.2)

## 2020-11-20 LAB — MAGNESIUM: Magnesium: 1.7 mg/dL (ref 1.7–2.4)

## 2020-11-20 LAB — PROTIME-INR
INR: 1.2 (ref 0.8–1.2)
Prothrombin Time: 14.8 seconds (ref 11.4–15.2)

## 2020-11-20 MED ORDER — ENOXAPARIN SODIUM 40 MG/0.4ML IJ SOSY: 40.0000 mg | PREFILLED_SYRINGE | INTRAMUSCULAR | Status: DC

## 2020-11-20 MED ORDER — IOHEXOL 9 MG/ML PO SOLN
500.0000 mL | ORAL | Status: AC
  Administered 2020-11-20 (×2): 500 mL via ORAL

## 2020-11-20 NOTE — Consult Note (Signed)
Consult Note  Sheila DubinLauren Taylor Mcclain February 23, 1986  [id number].    Requesting MD: Dr. Lanae Boastamesh Kc Chief Complaint/Reason for Consult: Intussusception  HPI:  Patient is an otherwise healthy 35 year old female who [facility] to [facility] yesterday with abdominal discomfort that started evening of 8/31. Patient reported pain progressed from intermittent sharp crampy pain that was diffuse over upper abdomen to more constant morning of 9/1. She has had associated nausea and one episode of bilious emesis yesterday AM, no diarrhea or dark stools. She reported a normal BM yesterday AM. She also reported fatigue and chills. She was able to pass some flatus yesterday but has not had a BM. Currently reports pain and nausea are much better than yesterday. She has not had any prior abdominal surgery. She does not take any blood thinning medications. She reported an allergy to clindamycin.   ROS: Review of Systems  Constitutional:  Positive for chills and malaise/fatigue. Negative for fever.  Respiratory:  Negative for shortness of breath and wheezing.   Cardiovascular:  Negative for chest pain and palpitations.  Gastrointestinal:  Positive for abdominal pain, nausea and vomiting. Negative for blood in stool, constipation, diarrhea and melena.  Genitourinary:  Negative for dysuria, frequency and urgency.  All other systems reviewed and are negative.  Family History  Problem Relation Age of Onset   Pancreatic cancer Mother     Past Medical History:  Diagnosis Date   Chronic back pain    Kidney stones     Past Surgical History:  Procedure Laterality Date   FRACTURE SURGERY     iud placement     PLACEMENT OF BREAST IMPLANTS      Social History:  reports that she has never smoked. She has never used smokeless tobacco. She reports current alcohol use. She reports that she does not use drugs.  Allergies:  Allergies  Allergen Reactions   Clindamycin Diarrhea and Other (See Comments)     Thrombocytopenia Thrombocytopenia     Medications Prior to Admission  Medication Sig Dispense Refill   tinidazole (TINDAMAX) 500 MG tablet Take 1,000 mg by mouth daily. 5 day supply (Patient not taking: Reported on 11/20/2020)      Blood pressure 109/60, pulse 72, temperature 98.2 F (36.8 C), temperature source Oral, resp. rate 18, height 5\' 8"  (1.727 m), weight 70.8 kg, last menstrual period 11/12/2020, SpO2 100 %. Physical Exam:  General: pleasant, WD, WN female who is laying in bed in NAD HEENT: head is normocephalic, atraumatic.  Sclera are noninjected.  PERRL.  Ears and nose without any masses or lesions.  Mouth is pink and moist Heart: regular, rate, and rhythm.  Normal s1,s2. No obvious murmurs, gallops, or rubs noted.  Palpable radial and pedal pulses bilaterally Lungs: CTAB, no wheezes, rhonchi, or rales noted.  Respiratory effort nonlabored Abd: soft, NT, ND, +BS, diastasis present  MS: all 4 extremities are symmetrical with no cyanosis, clubbing, or edema. Skin: warm and dry with no masses, lesions, or rashes Neuro: Cranial nerves 2-12 grossly intact, sensation is normal throughout Psych: A&Ox3 with an appropriate affect.   Results for orders placed or performed during the hospital encounter of 11/19/20 (from the past 48 hour(s))  Comprehensive metabolic panel     Status: None   Collection Time: 11/19/20  4:48 PM  Result Value Ref Range   Sodium 139 135 - 145 mmol/L   Potassium 4.1 3.5 - 5.1 mmol/L   Chloride 105 98 - 111 mmol/L   CO2  28 22 - 32 mmol/L   Glucose, Bld 99 70 - 99 mg/dL    Comment: Glucose reference range applies only to samples taken after fasting for at least 8 hours.   BUN 18 6 - 20 mg/dL   Creatinine, Ser 4.09 0.44 - 1.00 mg/dL   Calcium 9.0 8.9 - 81.1 mg/dL   Total Protein 7.2 6.5 - 8.1 g/dL   Albumin 4.6 3.5 - 5.0 g/dL   AST 20 15 - 41 U/L   ALT 20 0 - 44 U/L   Alkaline Phosphatase 52 38 - 126 U/L   Total Bilirubin 0.8 0.3 - 1.2 mg/dL   GFR,  Estimated >91 >47 mL/min    Comment: (NOTE) Calculated using the CKD-EPI Creatinine Equation (2021)    Anion gap 6 5 - 15    Comment: Performed at [facility], [address] [place], [place] [address]  CBC with Differential     Status: Abnormal   Collection Time: 11/19/20  4:48 PM  Result Value Ref Range   WBC 5.3 4.0 - 10.5 K/uL   RBC 4.60 3.87 - 5.11 MIL/uL   Hemoglobin 14.2 12.0 - 15.0 g/dL   HCT 21.3 08.6 - 57.8 %   MCV 92.8 80.0 - 100.0 fL   MCH 30.9 26.0 - 34.0 pg   MCHC 33.3 30.0 - 36.0 g/dL   RDW 46.9 62.9 - 52.8 %   Platelets 140 (L) 150 - 400 K/uL   nRBC 0.0 0.0 - 0.2 %   Neutrophils Relative % 90 %   Neutro Abs 4.7 1.7 - 7.7 K/uL   Lymphocytes Relative 6 %   Lymphs Abs 0.3 (L) 0.7 - 4.0 K/uL   Monocytes Relative 4 %   Monocytes Absolute 0.2 0.1 - 1.0 K/uL   Eosinophils Relative 0 %   Eosinophils Absolute 0.0 0.0 - 0.5 K/uL   Basophils Relative 0 %   Basophils Absolute 0.0 0.0 - 0.1 K/uL   Immature Granulocytes 0 %   Abs Immature Granulocytes 0.01 0.00 - 0.07 K/uL    Comment: Performed at [facility], [address] [place], [place] [address]  Urinalysis, Routine w reflex microscopic Urine, Clean Catch     Status: None   Collection Time: 11/19/20  4:48 PM  Result Value Ref Range   Color, Urine YELLOW YELLOW   APPearance CLEAR CLEAR   Specific Gravity, Urine 1.020 1.005 - 1.030   pH 7.5 5.0 - 8.0   Glucose, UA NEGATIVE NEGATIVE mg/dL   Hgb urine dipstick NEGATIVE NEGATIVE   Bilirubin Urine NEGATIVE NEGATIVE   Ketones, ur NEGATIVE NEGATIVE mg/dL   Protein, ur NEGATIVE NEGATIVE mg/dL   Nitrite NEGATIVE NEGATIVE   Leukocytes,Ua NEGATIVE NEGATIVE    Comment: Microscopic not done on urines with negative protein, blood, leukocytes, nitrite, or glucose < 500 mg/dL. Performed at [facility], [address] [place], [place] [address]   Pregnancy, urine     Status: None   Collection Time: 11/19/20  4:48 PM  Result Value Ref  Range   Preg Test, Ur NEGATIVE NEGATIVE    Comment:        THE SENSITIVITY OF THIS METHODOLOGY IS >20 mIU/mL. Performed at [facility], [address] [place], [place] [address]   Lipase, blood     Status: None   Collection Time: 11/19/20  4:48 PM  Result Value Ref Range   Lipase 28 11 - 51 U/L  Comment: Performed at [facility], [address] [place], [place] [address]  Lactic acid, plasma     Status: None   Collection Time: 11/19/20  6:35 PM  Result Value Ref Range   Lactic Acid, Venous 0.6 0.5 - 1.9 mmol/L    Comment: Performed at [facility], [address] [place], [place] [address]  Resp Panel by RT-PCR (Flu A&B, Covid) Nasopharyngeal Swab     Status: None   Collection Time: 11/19/20  7:13 PM   Specimen: Nasopharyngeal Swab; Nasopharyngeal(NP) swabs in vial transport medium  Result Value Ref Range   SARS Coronavirus 2 by RT PCR NEGATIVE NEGATIVE    Comment: (NOTE) SARS-CoV-2 target nucleic acids are NOT DETECTED.  The SARS-CoV-2 RNA is generally detectable in upper respiratory specimens during the acute phase of infection. The lowest concentration of SARS-CoV-2 viral copies this assay can detect is 138 copies/mL. A negative result does not preclude SARS-Cov-2 infection and should not be used as the sole basis for treatment or other patient management decisions. A negative result may occur with  improper specimen collection/handling, submission of specimen other than nasopharyngeal swab, presence of viral mutation(s) within the areas targeted by this assay, and inadequate number of viral copies(<138 copies/mL). A negative result must be combined with clinical observations, patient history, and epidemiological information. The expected result is Negative.  Fact Sheet for Patients:  BloggerCourse.com  Fact Sheet for Healthcare Providers:  SeriousBroker.it  This test is no t yet  approved or cleared by the [place] FDA and  has been authorized for detection and/or diagnosis of SARS-CoV-2 by FDA under an Emergency Use Authorization (EUA). This EUA will remain  in effect (meaning this test can be used) for the duration of the COVID-19 declaration under Section 564(b)(1) of the Act, 21 U.S.C.section 360bbb-3(b)(1), unless the authorization is terminated  or revoked sooner.       Influenza A by PCR NEGATIVE NEGATIVE   Influenza B by PCR NEGATIVE NEGATIVE    Comment: (NOTE) The Xpert Xpress SARS-CoV-2/FLU/RSV plus assay is intended as an aid in the diagnosis of influenza from Nasopharyngeal swab specimens and should not be used as a sole basis for treatment. Nasal washings and aspirates are unacceptable for Xpert Xpress SARS-CoV-2/FLU/RSV testing.  Fact Sheet for Patients: BloggerCourse.com  Fact Sheet for Healthcare Providers: SeriousBroker.it  This test is not yet approved or cleared by the [place] FDA and has been authorized for detection and/or diagnosis of SARS-CoV-2 by FDA under an Emergency Use Authorization (EUA). This EUA will remain in effect (meaning this test can be used) for the duration of the COVID-19 declaration under Section 564(b)(1) of the Act, 21 U.S.C. section 360bbb-3(b)(1), unless the authorization is terminated or revoked.  Performed at [facility], [address], [place], [place] [address]   Magnesium     Status: None   Collection Time: 11/20/20  4:21 AM  Result Value Ref Range   Magnesium 1.7 1.7 - 2.4 mg/dL    Comment: Performed at [facility], [address] [place], [place] [address]  Comprehensive metabolic panel     Status: Abnormal   Collection Time: 11/20/20  4:21 AM  Result Value Ref Range   Sodium 137 135 - 145 mmol/L   Potassium 3.6 3.5 - 5.1 mmol/L   Chloride 104 98 - 111 mmol/L   CO2 26 22 - 32 mmol/L   Glucose, Bld 95  70 - 99 mg/dL  Comment: Glucose reference range applies only to samples taken after fasting for at least 8 hours.   BUN 13 6 - 20 mg/dL   Creatinine, Ser 2.29 0.44 - 1.00 mg/dL   Calcium 8.4 (L) 8.9 - 10.3 mg/dL   Total Protein 5.6 (L) 6.5 - 8.1 g/dL   Albumin 3.6 3.5 - 5.0 g/dL   AST 14 (L) 15 - 41 U/L   ALT 15 0 - 44 U/L   Alkaline Phosphatase 41 38 - 126 U/L   Total Bilirubin 0.9 0.3 - 1.2 mg/dL   GFR, Estimated >79 >89 mL/min    Comment: (NOTE) Calculated using the CKD-EPI Creatinine Equation (2021)    Anion gap 7 5 - 15    Comment: Performed at [facility], [address], [place], [place] [address]  CBC     Status: Abnormal   Collection Time: 11/20/20  4:21 AM  Result Value Ref Range   WBC 3.2 (L) 4.0 - 10.5 K/uL   RBC 3.81 (L) 3.87 - 5.11 MIL/uL   Hemoglobin 11.6 (L) 12.0 - 15.0 g/dL   HCT 17.4 (L) 08.1 - 44.8 %   MCV 92.7 80.0 - 100.0 fL   MCH 30.4 26.0 - 34.0 pg   MCHC 32.9 30.0 - 36.0 g/dL   RDW 18.5 63.1 - 49.7 %   Platelets 116 (L) 150 - 400 K/uL    Comment: SPECIMEN CHECKED FOR CLOTS Immature Platelet Fraction may be clinically indicated, consider ordering this additional test [id number] REPEATED TO VERIFY PLATELET COUNT CONFIRMED BY SMEAR    nRBC 0.0 0.0 - 0.2 %    Comment: Performed at [facility], [address] [place], [place] [address]  Protime-INR     Status: None   Collection Time: 11/20/20  4:21 AM  Result Value Ref Range   Prothrombin Time 14.8 11.4 - 15.2 seconds   INR 1.2 0.8 - 1.2    Comment: (NOTE) INR goal varies based on device and disease states. Performed at [facility], [address], [place], [place] [address]    CT ABDOMEN PELVIS W CONTRAST  Result Date: 11/19/2020 CLINICAL DATA:  Epigastric pain. Chronic back pain. feels like the pain may be related to a kidney stone EXAM: CT ABDOMEN AND PELVIS WITH CONTRAST TECHNIQUE: Multidetector CT imaging of the abdomen and pelvis  was performed using the standard protocol following bolus administration of intravenous contrast. CONTRAST:  35mL OMNIPAQUE IOHEXOL 350 MG/ML SOLN COMPARISON:  None. FINDINGS: Lower chest: Bilateral breast implants partially visualized. No acute abnormality. Hepatobiliary: No focal liver abnormality. No gallstones, gallbladder wall thickening, or pericholecystic fluid. No biliary dilatation. Pancreas: No focal lesion. Normal pancreatic contour. No surrounding inflammatory changes. No main pancreatic ductal dilatation. Spleen: Normal in size without focal abnormality. Adrenals/Urinary Tract: No adrenal nodule bilaterally. Bilateral kidneys enhance symmetrically. Subcentimeter hypodensity within the right kidney is too small to characterize. No nephroureterolithiasis. No nephroureterolithiasis.  No hydronephrosis. No hydroureter. The urinary bladder is unremarkable. Stomach/Bowel: Stomach is within normal limits. Left upper abdomen intussusception of the proximal small bowel telescoping into the small bowel and measuring approximately 5.5 cm in length (2:41, 5:56). Otherwise no small bowel wall thickening or dilatation. No evidence of large bowel wall thickening or dilatation. The appendix not definitely identified with no right lower quadrant inflammatory changes to suggest acute appendicitis. Vascular/Lymphatic: No abdominal aorta or iliac aneurysm. No abdominal, pelvic, or inguinal lymphadenopathy. Reproductive: A t shaped intrauterine device is noted within the uterus in  grossly appropriate position. Otherwise the uterus and bilateral adnexal regions are unremarkable. Other: No intraperitoneal free fluid. No intraperitoneal free gas. No organized fluid collection. Musculoskeletal: No abdominal wall hernia or abnormality. No suspicious lytic or blastic osseous lesions. No acute displaced fracture. Endplate sclerosis L4-L5. IMPRESSION: 1. Left upper abdomen proximal small bowel into small bowel intussusception  measuring approximately 5.5 cm in length. 2. T-shaped intrauterine device in grossly appropriate position. 3. No nephroureterolithiasis. Electronically Signed   By: Tish Frederickson M.D.   On: 11/19/2020 18:21      Assessment/Plan Possible Intussusception  - CT overnight with 5 cm segment intussuscepted proximal small bowel - abdominal pain now significantly improved and patient passing flatus and denies nausea - intussusception could have just been transient, but will get follow up CT with PO contrast to re-evaluate - if there is still evidence of intussusception, will likely need diagnostic laparoscopy  FEN: NPO, IVF  cc/h VTE: SCDs ID: no current abx  Juliet Rude, [facility] Surgery 11/20/2020, 9:47 AM Please see Amion for pager number during day hours 7:00am-4:30pm

## 2020-11-20 NOTE — Progress Notes (Signed)
PROGRESS NOTE    Sheila Mcclain  FGH:[id number] DOB: [birth year] DOA: 11/19/2020 PCP: Pcp, No   Chief Complaint  Patient presents with   Back Pain   Brief Narrative: 35 year old female with chronic low back pain, kidney stone presented to med [facility] with abdominal pain onset 1 day and evening of 8/31st sharp discomfort over bilateral upper abdominal quadrant left greater than right, intermittent nausea and one episode of nonbloody nonbilious vomiting on 9/1.  Last BM 8/31.  She has IUD in place..  Had 2 prior kidney stones. In the ED:CT abdomen showed". Left upper abdomen proximal small bowel into small bowel intussusception measuring approximately 5.5 cm in length. 2. T-shaped intrauterine device in grossly appropriate position".  General surgery consulted, patient was admitted to [facility] long for further management.  Routine blood works fairly stable normal lactic acid.  Subjective: Seen and examined this morning Reports pain is better 3/10 now, last pain meds last night No nausea or Vomiting since admission Feels better overall.  Assessment & Plan:  Intussusception Abdominal pain with nausea and vomiting: Presenting with 1 day history of abdominal pain, CT showed left upper abdomen proximal small bowel into a small bowel intussusception measuring 5.5 centimeters.  Surgery consulted and notified PA, cont conservative management ,n.p.o., IV fluids, pain control.  Discussed with surgery, they are planning to repeat CT abdomen with p.o. contrast.  Mild thrombocytopenia-monitor. Mild leukopenia monitor Chronic back pain: Stable continue home meds History of kidney stone-CT abdomen unremarkable  Diet Order             Diet NPO time specified  Diet effective now                  Patient's Body mass index is 23.72 kg/m. DVT prophylaxis: SCDs Start: 11/19/20 2313 encouraged ambulation, she has agreed. Code Status:   Code Status: Full Code  Family Communication:  plan of care discussed with patient at bedside. Status is: Inpatient Remains inpatient appropriate because:IV treatments appropriate due to intensity of illness or inability to take PO and Inpatient level of care appropriate due to severity of illness Dispo: The patient is from: Home              Anticipated d/c is to: Home              Patient currently is not medically stable to d/c.   Difficult to place patient No Unresulted Labs (From admission, onward)     Start     Ordered   11/20/20 0500  HIV Antibody (routine testing w rflx)  (HIV Antibody (Routine testing w reflex) panel)  Tomorrow morning,   R        11/19/20 2313            Medications reviewed:  Scheduled Meds: Continuous Infusions:  lactated ringers 125 mL/hr at 11/19/20 2330   Consultants:see note  Procedures:see note Antimicrobials: Anti-infectives (From admission, onward)    None      Culture/Microbiology No results found for: SDES, SPECREQUEST, CULT, REPTSTATUS  Other culture-see note  Objective: Vitals: Today's Vitals   11/19/20 2331 11/20/20 0001 11/20/20 0146 11/20/20 0542  BP:   108/68 109/60  Pulse:   70 72  Resp:   18 18  Temp:   98.3 F (36.8 C) 98.2 F (36.8 C)  TempSrc:    Oral  SpO2:   100% 100%  Weight:      Height:      PainSc: 8  Asleep  Intake/Output Summary (Last 24 hours) at 11/20/2020 0843 Last data filed at 11/20/2020 0550 Gross per 24 hour  Intake 2271.4 ml  Output --  Net 2271.4 ml   Filed Weights   11/19/20 1618  Weight: 70.8 kg   Weight change:   Intake/Output from previous day: 09/01 0701 - 09/02 0700 In: 2271.4 [I.V.:789.5; IV Piggyback:1482] Out: -  Intake/Output this shift: No intake/output data recorded. Filed Weights   11/19/20 1618  Weight: 70.8 kg   Examination: General exam: AAO x3 pleasant, NAD HEENT:Oral mucosa moist, Ear/Nose WNL grossly,dentition normal. Respiratory system: bilaterally diminished,no use of accessory muscle, non  tender. Cardiovascular system: S1 & S2 +,No JVD. Gastrointestinal system: Abdomen soft, generalized tenderness more on Left abd, BS++ Nervous System:Alert, awake, moving extremities Extremities: no edema, distal peripheral pulses palpable.  Skin: No rashes,no icterus. MSK: Normal muscle bulk,tone, power.  Data Reviewed: I have personally reviewed following labs and imaging studies CBC: Recent Labs  Lab 11/19/20 1648 11/20/20 0421  WBC 5.3 3.2*  NEUTROABS 4.7  --   HGB 14.2 11.6*  HCT 42.7 35.3*  MCV 92.8 92.7  PLT 140* 116*   Basic Metabolic Panel: Recent Labs  Lab 11/19/20 1648 11/20/20 0421  NA 139 137  K 4.1 3.6  CL 105 104  CO2 28 26  GLUCOSE 99 95  BUN 18 13  CREATININE 0.96 0.93  CALCIUM 9.0 8.4*  MG  --  1.7   GFR: Estimated Creatinine Clearance: 86.8 mL/min (by C-G formula based on SCr of 0.93 mg/dL). Liver Function Tests: Recent Labs  Lab 11/19/20 1648 11/20/20 0421  AST 20 14*  ALT 20 15  ALKPHOS 52 41  BILITOT 0.8 0.9  PROT 7.2 5.6*  ALBUMIN 4.6 3.6   Recent Labs  Lab 11/19/20 1648  LIPASE 28   No results for input(s): AMMONIA in the last 168 hours. Coagulation Profile: Recent Labs  Lab 11/20/20 0421  INR 1.2   Cardiac Enzymes: No results for input(s): CKTOTAL, CKMB, CKMBINDEX, TROPONINI in the last 168 hours. BNP (last 3 results) No results for input(s): PROBNP in the last 8760 hours. HbA1C: No results for input(s): HGBA1C in the last 72 hours. CBG: No results for input(s): GLUCAP in the last 168 hours. Lipid Profile: No results for input(s): CHOL, HDL, LDLCALC, TRIG, CHOLHDL, LDLDIRECT in the last 72 hours. Thyroid Function Tests: No results for input(s): TSH, T4TOTAL, FREET4, T3FREE, THYROIDAB in the last 72 hours. Anemia Panel: No results for input(s): VITAMINB12, FOLATE, FERRITIN, TIBC, IRON, RETICCTPCT in the last 72 hours. Sepsis Labs: Recent Labs  Lab 11/19/20 1835  LATICACIDVEN 0.6    Recent Results (from the past  240 hour(s))  Resp Panel by RT-PCR (Flu A&B, Covid) Nasopharyngeal Swab     Status: None   Collection Time: 11/19/20  7:13 PM   Specimen: Nasopharyngeal Swab; Nasopharyngeal(NP) swabs in vial transport medium  Result Value Ref Range Status   SARS Coronavirus 2 by RT PCR NEGATIVE NEGATIVE Final    Comment: (NOTE) SARS-CoV-2 target nucleic acids are NOT DETECTED.  The SARS-CoV-2 RNA is generally detectable in upper respiratory specimens during the acute phase of infection. The lowest concentration of SARS-CoV-2 viral copies this assay can detect is 138 copies/mL. A negative result does not preclude SARS-Cov-2 infection and should not be used as the sole basis for treatment or other patient management decisions. A negative result may occur with  improper specimen collection/handling, submission of specimen other than nasopharyngeal swab, presence of viral mutation(s) within the  areas targeted by this assay, and inadequate number of viral copies(<138 copies/mL). A negative result must be combined with clinical observations, patient history, and epidemiological information. The expected result is Negative.  Fact Sheet for Patients:  BloggerCourse.comhttps://www.fda.gov/media/152166/download  Fact Sheet for Healthcare Providers:  SeriousBroker.ithttps://www.fda.gov/media/152162/download  This test is no t yet approved or cleared by the [place] FDA and  has been authorized for detection and/or diagnosis of SARS-CoV-2 by FDA under an Emergency Use Authorization (EUA). This EUA will remain  in effect (meaning this test can be used) for the duration of the COVID-19 declaration under Section 564(b)(1) of the Act, 21 U.S.C.section 360bbb-3(b)(1), unless the authorization is terminated  or revoked sooner.       Influenza A by PCR NEGATIVE NEGATIVE Final   Influenza B by PCR NEGATIVE NEGATIVE Final    Comment: (NOTE) The Xpert Xpress SARS-CoV-2/FLU/RSV plus assay is intended as an aid in the diagnosis of influenza  from Nasopharyngeal swab specimens and should not be used as a sole basis for treatment. Nasal washings and aspirates are unacceptable for Xpert Xpress SARS-CoV-2/FLU/RSV testing.  Fact Sheet for Patients: BloggerCourse.comhttps://www.fda.gov/media/152166/download  Fact Sheet for Healthcare Providers: SeriousBroker.ithttps://www.fda.gov/media/152162/download  This test is not yet approved or cleared by the [place] FDA and has been authorized for detection and/or diagnosis of SARS-CoV-2 by FDA under an Emergency Use Authorization (EUA). This EUA will remain in effect (meaning this test can be used) for the duration of the COVID-19 declaration under Section 564(b)(1) of the Act, 21 U.S.C. section 360bbb-3(b)(1), unless the authorization is terminated or revoked.  Performed at [facility], [address] [place], [place] [id number]      Radiology Studies: CT ABDOMEN PELVIS W CONTRAST  Result Date: 11/19/2020 CLINICAL DATA:  Epigastric pain. Chronic back pain. feels like the pain may be related to a kidney stone EXAM: CT ABDOMEN AND PELVIS WITH CONTRAST TECHNIQUE: Multidetector CT imaging of the abdomen and pelvis was performed using the standard protocol following bolus administration of intravenous contrast. CONTRAST:  85mL OMNIPAQUE IOHEXOL 350 MG/ML SOLN COMPARISON:  None. FINDINGS: Lower chest: Bilateral breast implants partially visualized. No acute abnormality. Hepatobiliary: No focal liver abnormality. No gallstones, gallbladder wall thickening, or pericholecystic fluid. No biliary dilatation. Pancreas: No focal lesion. Normal pancreatic contour. No surrounding inflammatory changes. No main pancreatic ductal dilatation. Spleen: Normal in size without focal abnormality. Adrenals/Urinary Tract: No adrenal nodule bilaterally. Bilateral kidneys enhance symmetrically. Subcentimeter hypodensity within the right kidney is too small to characterize. No nephroureterolithiasis. No nephroureterolithiasis.  No  hydronephrosis. No hydroureter. The urinary bladder is unremarkable. Stomach/Bowel: Stomach is within normal limits. Left upper abdomen intussusception of the proximal small bowel telescoping into the small bowel and measuring approximately 5.5 cm in length (2:41, 5:56). Otherwise no small bowel wall thickening or dilatation. No evidence of large bowel wall thickening or dilatation. The appendix not definitely identified with no right lower quadrant inflammatory changes to suggest acute appendicitis. Vascular/Lymphatic: No abdominal aorta or iliac aneurysm. No abdominal, pelvic, or inguinal lymphadenopathy. Reproductive: A t shaped intrauterine device is noted within the uterus in grossly appropriate position. Otherwise the uterus and bilateral adnexal regions are unremarkable. Other: No intraperitoneal free fluid. No intraperitoneal free gas. No organized fluid collection. Musculoskeletal: No abdominal wall hernia or abnormality. No suspicious lytic or blastic osseous lesions. No acute displaced fracture. Endplate sclerosis L4-L5. IMPRESSION: 1. Left upper abdomen proximal small bowel into small bowel intussusception measuring approximately 5.5 cm in length. 2. T-shaped intrauterine device in  grossly appropriate position. 3. No nephroureterolithiasis. Electronically Signed   By: Tish Frederickson M.D.   On: 11/19/2020 18:21     LOS: 1 day   Lanae Boast, MD [facility]  11/20/2020, 8:43 AM

## 2020-11-20 NOTE — Progress Notes (Signed)
Ct scan without evidence of intussusception and symptoms remain resolved. Reviewed with Dr. Cliffton Asters. No surgery indicated. She is cleared for diet from our standpoint. She should follow up with GI outpatient

## 2020-11-21 LAB — CBC
HCT: 35.5 % — ABNORMAL LOW (ref 36.0–46.0)
Hemoglobin: 11.7 g/dL — ABNORMAL LOW (ref 12.0–15.0)
MCH: 30.5 pg (ref 26.0–34.0)
MCHC: 33 g/dL (ref 30.0–36.0)
MCV: 92.7 fL (ref 80.0–100.0)
Platelets: 139 10*3/uL — ABNORMAL LOW (ref 150–400)
RBC: 3.83 MIL/uL — ABNORMAL LOW (ref 3.87–5.11)
RDW: 11.9 % (ref 11.5–15.5)
WBC: 3 10*3/uL — ABNORMAL LOW (ref 4.0–10.5)
nRBC: 0 % (ref 0.0–0.2)

## 2020-11-21 NOTE — Plan of Care (Signed)

## 2020-11-21 NOTE — Progress Notes (Signed)
Assessment unchanged. Pt verbalized understanding of dc instructions. Pt discharged via foot to front entrance.

## 2020-11-21 NOTE — Discharge Summary (Signed)
Physician Discharge Summary  Sheila Mcclain EHO:[id number] DOB: 22-Aug-1985 DOA: 11/19/2020  PCP: Pcp, No  Admit date: 11/19/2020 Discharge date: 11/21/2020  Admitted From: home Disposition:  home  Recommendations for Outpatient Follow-up:  Follow up with PCP in 1-2 weeks Follow-up with gastroenterology  Home Health:none  Equipment/Devices: none  Discharge Condition: Stable Code Status:   Code Status: Full Code Diet recommendation:  Diet Order             DIET SOFT Room service appropriate? Yes; Fluid consistency: Thin  Diet effective now                    Brief/Interim Summary: 35 year old female with chronic low back pain, kidney stone presented to med [facility] with abdominal pain onset 1 day and evening of 8/31st sharp discomfort over bilateral upper abdominal quadrant left greater than right, intermittent nausea and one episode of nonbloody nonbilious vomiting on 9/1.  Last BM 8/31.  She has IUD in place..  Had 2 prior kidney stones. In the ED:CT abdomen showed". Left upper abdomen proximal small bowel into small bowel intussusception measuring approximately 5.5 cm in length. 2. T-shaped intrauterine device in grossly appropriate position".  General surgery consulted, patient was admitted to [facility] long for further management.  Routine blood works fairly stable normal lactic acid Patient is admitted.  Kept NPO.  Surgery was consulted CT abdomen repeated with oral scan no intussusception seen and placed on diet tolerating.  Has had a bowel movement mild soreness.  Suspect transient intussusception that has resolved.  Advised GI follow-up as outpatient number provided  Discharge Diagnoses:   Intussusception Abdominal pain with nausea and vomiting: Resolved.  Repeat CT abdomen normal.  Seen by surgery.  Will need outpatient GI follow-up.  Tolerating diet.  Mild thrombocytopenia-monitor.  Improving.  CBC as outpatient Mild leukopenia monitor CBC as  outpatient Chronic back pain: Stable continue home meds History of kidney stone-CT abdomen unremarkable  Consults: General surgery  Subjective: Alert awake oriented, mild abdominal soreness but resolved.  Has had a bowel movement tolerating diet.  Ate spaghetti last night  Discharge Exam: Vitals:   11/21/20 0101 11/21/20 0525  BP: 106/69 (!) 97/48  Pulse: 61 (!) 57  Resp: 17 18  Temp: 98.1 F (36.7 C) 97.6 F (36.4 C)  SpO2: 99% 99%   General: Pt is alert, awake, not in acute distress Cardiovascular: RRR, S1/S2 +, no rubs, no gallops Respiratory: CTA bilaterally, no wheezing, no rhonchi Abdominal: Soft, NT, ND, bowel sounds + Extremities: no edema, no cyanosis  Discharge Instructions  Discharge Instructions     Discharge instructions   Complete by: As directed    Please follow-up with primary care doctor or community wellness center number provided.  You need to follow-up with gastroenterology, you are provided with [facility] GI office number but can get referral from PCP if needed  Please call call MD or return to ER for similar or worsening recurring problem that brought you to hospital or if any fever,nausea/vomiting,abdominal pain, uncontrolled pain, chest pain,  shortness of breath or any other alarming symptoms.  Please follow-up your doctor as instructed in a week time and call the office for appointment.  Please avoid alcohol, smoking, or any other illicit substance and maintain healthy habits including taking your regular medications as prescribed.  You were cared for by a hospitalist during your hospital stay. If you have any questions about your discharge medications or the care you received while you were  in the hospital after you are discharged, you can call the unit and ask to speak with the hospitalist on call if the hospitalist that took care of you is not available.  Once you are discharged, your primary care physician will handle any further medical issues.  Please note that NO REFILLS for any discharge medications will be authorized once you are discharged, as it is imperative that you return to your primary care physician (or establish a relationship with a primary care physician if you do not have one) for your aftercare needs so that they can reassess your need for medications and monitor your lab values   Increase activity slowly   Complete by: As directed       Allergies as of 11/21/2020       Reactions   Clindamycin Diarrhea, Other (See Comments)   Thrombocytopenia Thrombocytopenia        Medication List     TAKE these medications    tinidazole 500 MG tablet Commonly known as: TINDAMAX Take 1,000 mg by mouth daily. 5 day supply        Follow-up Information     Kerin SalenKarki, Arya, MD. Schedule an appointment as soon as possible for a visit.   Specialty: Gastroenterology Contact information: [address] [place] [address] [phone]         [facility]. Schedule an appointment as soon as possible for a visit.   Contact information: [address] [place] [phone]               Allergies  Allergen Reactions   Clindamycin Diarrhea and Other (See Comments)    Thrombocytopenia Thrombocytopenia     The results of significant diagnostics from this hospitalization (including imaging, microbiology, ancillary and laboratory) are listed below for reference.    Microbiology: Recent Results (from the past 240 hour(s))  Resp Panel by RT-PCR (Flu A&B, Covid) Nasopharyngeal Swab     Status: None   Collection Time: 11/19/20  7:13 PM   Specimen: Nasopharyngeal Swab; Nasopharyngeal(NP) swabs in vial transport medium  Result Value Ref Range Status   SARS Coronavirus 2 by RT PCR NEGATIVE NEGATIVE Final    Comment: (NOTE) SARS-CoV-2 target nucleic acids are NOT DETECTED.  The SARS-CoV-2 RNA is generally detectable in upper  respiratory specimens during the acute phase of infection. The lowest concentration of SARS-CoV-2 viral copies this assay can detect is 138 copies/mL. A negative result does not preclude SARS-Cov-2 infection and should not be used as the sole basis for treatment or other patient management decisions. A negative result may occur with  improper specimen collection/handling, submission of specimen other than nasopharyngeal swab, presence of viral mutation(s) within the areas targeted by this assay, and inadequate number of viral copies(<138 copies/mL). A negative result must be combined with clinical observations, patient history, and epidemiological information. The expected result is Negative.  Fact Sheet for Patients:  BloggerCourse.comhttps://www.fda.gov/media/152166/download  Fact Sheet for Healthcare Providers:  SeriousBroker.ithttps://www.fda.gov/media/152162/download  This test is no t yet approved or cleared by the [place] FDA and  has been authorized for detection and/or diagnosis of SARS-CoV-2 by FDA under an Emergency Use Authorization (EUA). This EUA will remain  in effect (meaning this test can be used) for the duration of the COVID-19 declaration under Section 564(b)(1) of the Act, 21 U.S.C.section 360bbb-3(b)(1), unless the authorization is terminated  or revoked sooner.       Influenza A by PCR NEGATIVE  NEGATIVE Final   Influenza B by PCR NEGATIVE NEGATIVE Final    Comment: (NOTE) The Xpert Xpress SARS-CoV-2/FLU/RSV plus assay is intended as an aid in the diagnosis of influenza from Nasopharyngeal swab specimens and should not be used as a sole basis for treatment. Nasal washings and aspirates are unacceptable for Xpert Xpress SARS-CoV-2/FLU/RSV testing.  Fact Sheet for Patients: BloggerCourse.com  Fact Sheet for Healthcare Providers: SeriousBroker.it  This test is not yet approved or cleared by the [place] FDA and has been  authorized for detection and/or diagnosis of SARS-CoV-2 by FDA under an Emergency Use Authorization (EUA). This EUA will remain in effect (meaning this test can be used) for the duration of the COVID-19 declaration under Section 564(b)(1) of the Act, 21 U.S.C. section 360bbb-3(b)(1), unless the authorization is terminated or revoked.  Performed at [facility], [address] [place], [place] [address]     Procedures/Studies: CT ABDOMEN PELVIS WO CONTRAST  Result Date: 11/20/2020 CLINICAL DATA:  Constant lower abdominal and back pain with vomiting since yesterday. Intussusception suspected. EXAM: CT ABDOMEN AND PELVIS WITHOUT CONTRAST TECHNIQUE: Multidetector CT imaging of the abdomen and pelvis was performed following the standard protocol without IV contrast. COMPARISON:  Abdominopelvic CT 11/19/2020. FINDINGS: Lower chest: Clear lung bases. No significant pleural or pericardial effusion. Bilateral breast implants are partially imaged. Hepatobiliary: The liver appears unremarkable as imaged in the noncontrast state. There is dependent high density within the gallbladder lumen, new from recent CT and attributed to vicarious excretion of contrast. No evidence of gallbladder wall thickening or biliary dilatation. Pancreas: Unremarkable. No pancreatic ductal dilatation or surrounding inflammatory changes. Spleen: Normal in size without focal abnormality. Adrenals/Urinary Tract: Both adrenal glands appear normal. The kidneys appear normal without evidence of urinary tract calculus, suspicious lesion or hydronephrosis. No bladder abnormalities are seen. Stomach/Bowel: Enteric contrast for this examination has passed into the colon. There is no contrast within the stomach or small bowel. The previously demonstrated small bowel intussusception in the left mid abdomen is no longer clearly seen, although assessment is limited by the lack of small bowel enteric contrast and intravenous contrast. No  evidence of bowel wall thickening, distention or surrounding inflammation. The appendix is not clearly visualized. Vascular/Lymphatic: There are no enlarged abdominal or pelvic lymph nodes. No significant vascular findings on noncontrast imaging. Reproductive: Intrauterine device in place.  No adnexal mass. Other: No extravasated enteric contrast, free air or focal extraluminal fluid collection. Musculoskeletal: No acute or significant osseous findings. Asymmetric endplate degenerative changes on the right at L4-5. IMPRESSION: 1. No acute abdominal findings identified. 2. There is no evidence of bowel obstruction or perforation. The previously demonstrated small bowel intussusception is not clearly visualized, although assessment is limited by the lack of intravenous and small-bowel enteric contrast on this study. This may have represented incidental transient intussusception. Electronically Signed   By: Carey Bullocks M.D.   On: 11/20/2020 15:18   CT ABDOMEN PELVIS W CONTRAST  Result Date: 11/19/2020 CLINICAL DATA:  Epigastric pain. Chronic back pain. feels like the pain may be related to a kidney stone EXAM: CT ABDOMEN AND PELVIS WITH CONTRAST TECHNIQUE: Multidetector CT imaging of the abdomen and pelvis was performed using the standard protocol following bolus administration of intravenous contrast. CONTRAST:  85mL OMNIPAQUE IOHEXOL 350 MG/ML SOLN COMPARISON:  None. FINDINGS: Lower chest: Bilateral breast implants partially visualized. No acute abnormality. Hepatobiliary: No focal liver abnormality. No gallstones, gallbladder wall thickening, or pericholecystic fluid. No biliary dilatation. Pancreas: No  focal lesion. Normal pancreatic contour. No surrounding inflammatory changes. No main pancreatic ductal dilatation. Spleen: Normal in size without focal abnormality. Adrenals/Urinary Tract: No adrenal nodule bilaterally. Bilateral kidneys enhance symmetrically. Subcentimeter hypodensity within the right kidney  is too small to characterize. No nephroureterolithiasis. No nephroureterolithiasis.  No hydronephrosis. No hydroureter. The urinary bladder is unremarkable. Stomach/Bowel: Stomach is within normal limits. Left upper abdomen intussusception of the proximal small bowel telescoping into the small bowel and measuring approximately 5.5 cm in length (2:41, 5:56). Otherwise no small bowel wall thickening or dilatation. No evidence of large bowel wall thickening or dilatation. The appendix not definitely identified with no right lower quadrant inflammatory changes to suggest acute appendicitis. Vascular/Lymphatic: No abdominal aorta or iliac aneurysm. No abdominal, pelvic, or inguinal lymphadenopathy. Reproductive: A t shaped intrauterine device is noted within the uterus in grossly appropriate position. Otherwise the uterus and bilateral adnexal regions are unremarkable. Other: No intraperitoneal free fluid. No intraperitoneal free gas. No organized fluid collection. Musculoskeletal: No abdominal wall hernia or abnormality. No suspicious lytic or blastic osseous lesions. No acute displaced fracture. Endplate sclerosis L4-L5. IMPRESSION: 1. Left upper abdomen proximal small bowel into small bowel intussusception measuring approximately 5.5 cm in length. 2. T-shaped intrauterine device in grossly appropriate position. 3. No nephroureterolithiasis. Electronically Signed   By: Tish Frederickson M.D.   On: 11/19/2020 18:21    Labs: BNP (last 3 results) No results for input(s): BNP in the last 8760 hours. Basic Metabolic Panel: Recent Labs  Lab 11/19/20 1648 11/20/20 0421  NA 139 137  K 4.1 3.6  CL 105 104  CO2 28 26  GLUCOSE 99 95  BUN 18 13  CREATININE 0.96 0.93  CALCIUM 9.0 8.4*  MG  --  1.7   Liver Function Tests: Recent Labs  Lab 11/19/20 1648 11/20/20 0421  AST 20 14*  ALT 20 15  ALKPHOS 52 41  BILITOT 0.8 0.9  PROT 7.2 5.6*  ALBUMIN 4.6 3.6   Recent Labs  Lab 11/19/20 1648  LIPASE 28    No results for input(s): AMMONIA in the last 168 hours. CBC: Recent Labs  Lab 11/19/20 1648 11/20/20 0421 11/21/20 0514  WBC 5.3 3.2* 3.0*  NEUTROABS 4.7  --   --   HGB 14.2 11.6* 11.7*  HCT 42.7 35.3* 35.5*  MCV 92.8 92.7 92.7  PLT 140* 116* 139*   Cardiac Enzymes: No results for input(s): CKTOTAL, CKMB, CKMBINDEX, TROPONINI in the last 168 hours. BNP: Invalid input(s): POCBNP CBG: No results for input(s): GLUCAP in the last 168 hours. D-Dimer No results for input(s): DDIMER in the last 72 hours. Hgb A1c No results for input(s): HGBA1C in the last 72 hours. Lipid Profile No results for input(s): CHOL, HDL, LDLCALC, TRIG, CHOLHDL, LDLDIRECT in the last 72 hours. Thyroid function studies No results for input(s): TSH, T4TOTAL, T3FREE, THYROIDAB in the last 72 hours.  Invalid input(s): FREET3 Anemia work up No results for input(s): VITAMINB12, FOLATE, FERRITIN, TIBC, IRON, RETICCTPCT in the last 72 hours. Urinalysis    Component Value Date/Time   COLORURINE YELLOW 11/19/2020 1648   APPEARANCEUR CLEAR 11/19/2020 1648   LABSPEC 1.020 11/19/2020 1648   PHURINE 7.5 11/19/2020 1648   GLUCOSEU NEGATIVE 11/19/2020 1648   HGBUR NEGATIVE 11/19/2020 1648   BILIRUBINUR NEGATIVE 11/19/2020 1648   KETONESUR NEGATIVE 11/19/2020 1648   PROTEINUR NEGATIVE 11/19/2020 1648   NITRITE NEGATIVE 11/19/2020 1648   LEUKOCYTESUR NEGATIVE 11/19/2020 1648   Sepsis Labs Invalid input(s): PROCALCITONIN,  WBC,  LACTICIDVEN  Microbiology Recent Results (from the past 240 hour(s))  Resp Panel by RT-PCR (Flu A&B, Covid) Nasopharyngeal Swab     Status: None   Collection Time: 11/19/20  7:13 PM   Specimen: Nasopharyngeal Swab; Nasopharyngeal(NP) swabs in vial transport medium  Result Value Ref Range Status   SARS Coronavirus 2 by RT PCR NEGATIVE NEGATIVE Final    Comment: (NOTE) SARS-CoV-2 target nucleic acids are NOT DETECTED.  The SARS-CoV-2 RNA is generally detectable in upper  respiratory specimens during the acute phase of infection. The lowest concentration of SARS-CoV-2 viral copies this assay can detect is 138 copies/mL. A negative result does not preclude SARS-Cov-2 infection and should not be used as the sole basis for treatment or other patient management decisions. A negative result may occur with  improper specimen collection/handling, submission of specimen other than nasopharyngeal swab, presence of viral mutation(s) within the areas targeted by this assay, and inadequate number of viral copies(<138 copies/mL). A negative result must be combined with clinical observations, patient history, and epidemiological information. The expected result is Negative.  Fact Sheet for Patients:  BloggerCourse.com  Fact Sheet for Healthcare Providers:  SeriousBroker.it  This test is no t yet approved or cleared by the [place] FDA and  has been authorized for detection and/or diagnosis of SARS-CoV-2 by FDA under an Emergency Use Authorization (EUA). This EUA will remain  in effect (meaning this test can be used) for the duration of the COVID-19 declaration under Section 564(b)(1) of the Act, 21 U.S.C.section 360bbb-3(b)(1), unless the authorization is terminated  or revoked sooner.       Influenza A by PCR NEGATIVE NEGATIVE Final   Influenza B by PCR NEGATIVE NEGATIVE Final    Comment: (NOTE) The Xpert Xpress SARS-CoV-2/FLU/RSV plus assay is intended as an aid in the diagnosis of influenza from Nasopharyngeal swab specimens and should not be used as a sole basis for treatment. Nasal washings and aspirates are unacceptable for Xpert Xpress SARS-CoV-2/FLU/RSV testing.  Fact Sheet for Patients: BloggerCourse.com  Fact Sheet for Healthcare Providers: SeriousBroker.it  This test is not yet approved or cleared by the [place] FDA and has been  authorized for detection and/or diagnosis of SARS-CoV-2 by FDA under an Emergency Use Authorization (EUA). This EUA will remain in effect (meaning this test can be used) for the duration of the COVID-19 declaration under Section 564(b)(1) of the Act, 21 U.S.C. section 360bbb-3(b)(1), unless the authorization is terminated or revoked.  Performed at [facility], [address] [place], [place] [address]      Time coordinating discharge: 25 minutes  SIGNED: Lanae Boast, MD  [facility] 11/21/2020, 8:56 AM  If 7PM-7AM, please contact night-coverage [email or web address]

## 2022-03-20 ENCOUNTER — Other Ambulatory Visit: Payer: Self-pay

## 2022-03-20 ENCOUNTER — Emergency Department (HOSPITAL_BASED_OUTPATIENT_CLINIC_OR_DEPARTMENT_OTHER): Payer: BLUE CROSS/BLUE SHIELD

## 2022-03-20 ENCOUNTER — Encounter (HOSPITAL_BASED_OUTPATIENT_CLINIC_OR_DEPARTMENT_OTHER): Payer: Self-pay

## 2022-03-20 ENCOUNTER — Emergency Department (HOSPITAL_BASED_OUTPATIENT_CLINIC_OR_DEPARTMENT_OTHER)
Admission: EM | Admit: 2022-03-20 | Discharge: 2022-03-20 | Disposition: A | Discharge status: Home / Self Care | Payer: BLUE CROSS/BLUE SHIELD | Attending: Emergency Medicine | Admitting: Emergency Medicine

## 2022-03-20 DIAGNOSIS — O26891 Other specified pregnancy related conditions, first trimester: Secondary | ICD-10-CM | POA: Diagnosis present

## 2022-03-20 DIAGNOSIS — Z3A01 Less than 8 weeks gestation of pregnancy: Secondary | ICD-10-CM

## 2022-03-20 DIAGNOSIS — O0289 Other abnormal products of conception: Secondary | ICD-10-CM | POA: Diagnosis not present

## 2022-03-20 DIAGNOSIS — R1011 Right upper quadrant pain: Secondary | ICD-10-CM | POA: Insufficient documentation

## 2022-03-20 DIAGNOSIS — Z3A08 8 weeks gestation of pregnancy: Secondary | ICD-10-CM | POA: Insufficient documentation

## 2022-03-20 DIAGNOSIS — R1030 Lower abdominal pain, unspecified: Secondary | ICD-10-CM

## 2022-03-20 HISTORY — DX: Separation of muscle (nontraumatic), other site: M62.08

## 2022-03-20 LAB — CBC
HCT: 36.6 % (ref 36.0–46.0)
Hemoglobin: 12.4 g/dL (ref 12.0–15.0)
MCH: 30.4 pg (ref 26.0–34.0)
MCHC: 33.9 g/dL (ref 30.0–36.0)
MCV: 89.7 fL (ref 80.0–100.0)
Platelets: 141 10*3/uL — ABNORMAL LOW (ref 150–400)
RBC: 4.08 MIL/uL (ref 3.87–5.11)
RDW: 11.7 % (ref 11.5–15.5)
WBC: 7.4 10*3/uL (ref 4.0–10.5)
nRBC: 0 % (ref 0.0–0.2)

## 2022-03-20 LAB — COMPREHENSIVE METABOLIC PANEL
ALT: 64 U/L — ABNORMAL HIGH (ref 0–44)
AST: 55 U/L — ABNORMAL HIGH (ref 15–41)
Albumin: 4.1 g/dL (ref 3.5–5.0)
Alkaline Phosphatase: 104 U/L (ref 38–126)
Anion gap: 6 (ref 5–15)
BUN: 9 mg/dL (ref 6–20)
CO2: 25 mmol/L (ref 22–32)
Calcium: 9.4 mg/dL (ref 8.9–10.3)
Chloride: 106 mmol/L (ref 98–111)
Creatinine, Ser: 0.64 mg/dL (ref 0.44–1.00)
GFR, Estimated: >60 mL/min (ref >60)
Glucose, Bld: 106 mg/dL — ABNORMAL HIGH (ref 70–99)
Potassium: 4 mmol/L (ref 3.5–5.1)
Sodium: 137 mmol/L (ref 135–145)
Total Bilirubin: 0.4 mg/dL (ref 0.3–1.2)
Total Protein: 7.3 g/dL (ref 6.5–8.1)

## 2022-03-20 LAB — URINALYSIS, ROUTINE W REFLEX MICROSCOPIC
Bilirubin Urine: NEGATIVE
Glucose, UA: NEGATIVE mg/dL
Hgb urine dipstick: NEGATIVE
Ketones, ur: NEGATIVE mg/dL
Nitrite: NEGATIVE
Protein, ur: NEGATIVE mg/dL
Specific Gravity, Urine: 1.02 (ref 1.005–1.030)
pH: 7 (ref 5.0–8.0)

## 2022-03-20 LAB — PREGNANCY, URINE: Preg Test, Ur: POSITIVE — AB

## 2022-03-20 LAB — URINALYSIS, MICROSCOPIC (REFLEX): RBC / HPF: NONE SEEN RBC/hpf (ref 0–5)

## 2022-03-20 LAB — LIPASE, BLOOD: Lipase: 33 U/L (ref 11–51)

## 2022-03-20 LAB — HCG, QUANTITATIVE, PREGNANCY: hCG, Beta Chain, Quant, S: 227251 m[IU]/mL — ABNORMAL HIGH (ref <5)

## 2022-03-20 NOTE — ED Provider Notes (Signed)
[facility] EMERGENCY DEPARTMENT Provider Note   CSN: [id number] Arrival date & time: 03/20/22  3704     History  Chief Complaint  Patient presents with   Abdominal Pain    Sheila Mcclain is a 36 y.o. female with past medical history significant for intussusception, previous kidney stones who presents with concern for abdominal pain located in right upper abdomen, with radiation to mid, lower abdomen, without vaginal bleeding.  Patient reports that she did workout Thursday, and thought that she may be sore from the working out but that it has been persistent for 4 days.  Patient reports that she was woken up out of sleep because of the pain at 4 AM.  She denies any vaginal bleeding, discharge, dysuria, hematuria.  She denies significant nausea, vomiting.  She denies any previous gallbladder pathology.  She has not taken anything for pain at this time.   Abdominal Pain      Home Medications Prior to Admission medications   Medication Sig Start Date End Date Taking? Authorizing Provider  tinidazole (TINDAMAX) 500 MG tablet Take 1,000 mg by mouth daily. 5 day supply Patient not taking: Reported on 11/20/2020 11/11/20   [provider]      Allergies    Clindamycin    Review of Systems   Review of Systems  Gastrointestinal:  Positive for abdominal pain.  All other systems reviewed and are negative.   Physical Exam Updated Vital Signs BP 116/70   Pulse 92   Temp 97.8 F (36.6 C) (Oral)   Resp 16   Ht 5\' 8"  (1.727 m)   Wt 68 kg   LMP 01/23/2022   SpO2 100%   BMI 22.81 kg/m  Physical Exam Vitals and nursing note reviewed.  Constitutional:      General: She is not in acute distress.    Appearance: Normal appearance.  HENT:     Head: Normocephalic and atraumatic.  Eyes:     General:        Right eye: No discharge.        Left eye: No discharge.  Cardiovascular:     Rate and Rhythm: Normal rate and regular rhythm.     Heart sounds: No  murmur heard.    No friction rub. No gallop.  Pulmonary:     Effort: Pulmonary effort is normal.     Breath sounds: Normal breath sounds.  Abdominal:     General: Bowel sounds are normal.     Palpations: Abdomen is soft.     Comments: Patient with focal tenderness located in the mid abdomen, and lower quadrants, most focally in the right lower quadrant and towards the right upper quadrant but without positive Murphy sign.  No rebound, rigidity, guarding throughout.  Patient too early in pregnancy to appreciate uterine fundus.  Skin:    General: Skin is warm and dry.     Capillary Refill: Capillary refill takes less than 2 seconds.  Neurological:     Mental Status: She is alert and oriented to person, place, and time.  Psychiatric:        Mood and Affect: Mood normal.        Behavior: Behavior normal.     ED Results / Procedures / Treatments   Labs (all labs ordered are listed, but only abnormal results are displayed) Labs Reviewed  COMPREHENSIVE METABOLIC PANEL - Abnormal; Notable for the following components:      Result Value   Glucose, Bld 106 (*)  AST 55 (*)    ALT 64 (*)    All other components within normal limits  CBC - Abnormal; Notable for the following components:   Platelets 141 (*)    All other components within normal limits  URINALYSIS, ROUTINE W REFLEX MICROSCOPIC - Abnormal; Notable for the following components:   APPearance HAZY (*)    Leukocytes,Ua TRACE (*)    All other components within normal limits  PREGNANCY, URINE - Abnormal; Notable for the following components:   Preg Test, Ur POSITIVE (*)    All other components within normal limits  HCG, QUANTITATIVE, PREGNANCY - Abnormal; Notable for the following components:   hCG, Beta Chain, Quant, S 227,251 (*)    All other components within normal limits  URINALYSIS, MICROSCOPIC (REFLEX) - Abnormal; Notable for the following components:   Bacteria, UA MANY (*)    All other components within normal  limits  LIPASE, BLOOD    EKG None  Radiology US OB LESS THAN 14 WEEKS WITH OB TRANSVAGINAL  Result Date: 03/20/2022 CLINICAL DATA:  Pelvic pain in 1st trimester pregnancy. EXAM: OBSTETRIC <14 WK [place] AND TRANSVAGINAL OB US TECHNIQUE: Both transabdominal and transvaginal ultrasound examinations were performed for complete evaluation of the gestation as well as the maternal uterus, adnexal regions, and pelvic cul-de-sac. Transvaginal technique was performed to assess early pregnancy. COMPARISON:  None Available. FINDINGS: Intrauterine gestational sac: Single Yolk sac:  Visualized. Embryo:  Visualized. Cardiac Activity: Visualized. Heart Rate: 175 bpm CRL:  18 mm   8 w   2 d                  [place] EDC: 10/28/2022 Subchorionic hemorrhage:  None visualized. Maternal uterus/adnexae: Both ovaries are normal in appearance. No mass or abnormal free fluid identified. IMPRESSION: Single living IUP with estimated gestational age of [redacted] weeks 2 days, and [place] EDC of 10/28/2022. No maternal uterine or adnexal abnormality identified. Electronically Signed   By: Marlaine Hind M.D.   On: 03/20/2022 10:57    Procedures Procedures    Medications Ordered in ED Medications - No data to display  ED Course/ Medical Decision Making/ A&P                           Medical Decision Making  This patient is a 36 y.o. female  who presents to the ED for concern of generalized abdominal pain for the last 3 to 4 days, patient is notably around [redacted] weeks pregnant..   Differential diagnoses prior to evaluation: The emergent differential diagnosis includes, but is not limited to, ectopic pregnancy, miscarriage, appendicitis, cholecystitis, cholangitis, round ligament pain, UTI, STI, nephrolithiasis, pyelonephritis versus other, also considered molar pregnancy.   This is not an exhaustive differential.   Past Medical History / Co-morbidities: Patient with previous history of intussusception, she has had previous pregnancy, reports  that she was large for gestational age during her whole previous pregnancy, husband is 6\' 7" , reports that she had IUD removed on November 5, had a period for around a week, and has had sexual intercourse on 11/17 and 11/20.  Physical Exam: Physical exam performed. The pertinent findings include: Patient with some generalized tenderness throughout the abdomen without rebound, rigidity, guarding, uterine fundus not palpated, she does have some right-sided abdominal tenderness but no focal Murphy sign on my exam.  She is otherwise well-appearing with stable vital signs.  Lab Tests/Imaging studies: I personally interpreted labs/imaging and the pertinent results  include: CMP with very mild hyperglycemia with nonfasting lab value, glucose 106, she has very subtle elevations of AST, ALT, raise consideration for possible gallbladder pathology, although I think this lab finding along with a normal total bilirubin is likely of no clear significance at this time.  Patient with very mild thrombocytopenia, platelets 141.  Lipase 33.  Her beta-hCG is somewhat elevated at 227, 251, this would be appropriate for around a 6 to 8-week pregnancy, given that she is typically fairly large for gestational age think this is reasonable at this time, especially with no evidence of molar pregnancy on ultrasound.  Ultrasound shows pregnancy which appears around [redacted] weeks gestation, but is likely large for gestational age based on patient's reported history, no evidence of ectopic pregnancy, twins, or other abnormality I agree with the radiologist interpretation   Disposition: After consideration of the diagnostic results and the patients response to treatment, I feel that patient with appropriate pregnancy, no evidence of ectopic, or other abnormalities today, given that she reports that she worked out hard a few days ago suspicious for possible musculoskeletal etiology, but discussed that patient can follow-up as needed if she is  having persistent abdominal pain, mainly other suspicion would be subacute gallbladder pathology, but very low suspicion for acute cholecystitis, cholangitis at this time.   emergency department workup does not suggest an emergent condition requiring admission or immediate intervention beyond what has been performed at this time. The plan is: as above. The patient is safe for discharge and has been instructed to return immediately for worsening symptoms, change in symptoms or any other concerns.  Final Clinical Impression(s) / ED Diagnoses Final diagnoses:  Lower abdominal pain  Less than [redacted] weeks gestation of pregnancy    Rx / DC Orders ED Discharge Orders     None         Anselmo Pickler, PA-C 03/20/22 1138    Kemper Durie, DO 03/20/22 1551

## 2022-03-20 NOTE — ED Triage Notes (Addendum)
Pt reports she is 5 weeks pregnancy. Pain started on Thursday right upper abdomen and now pain is located mid to left lower abdomen. Denies vaginal bleeding. Pt did work out Thursday when the pain started . Severe Pain woke her up at 4 am

## 2022-03-20 NOTE — Discharge Instructions (Signed)
Please use Tylenol for pain.  You may use 1000 mg of Tylenol every 6 hours.  Not to exceed 4 g of Tylenol within 24 hours.  Please follow-up with your OB/GYN for normal ultrasound, if you continue to have significant abdominal pain that is not responding to pain control, rest, I recommend that you follow-up for further evaluation, the only other suspicion that I have although it does not agree with the symptoms you are experiencing today could be some gallbladder pathology, if your abdominal pain seems to be focally in your right upper abdomen and persistent you may want to return for reevaluation of possible gallbladder abnormality.

## 2022-04-18 ENCOUNTER — Encounter (HOSPITAL_BASED_OUTPATIENT_CLINIC_OR_DEPARTMENT_OTHER): Payer: Self-pay | Admitting: Emergency Medicine

## 2022-04-18 ENCOUNTER — Emergency Department (HOSPITAL_BASED_OUTPATIENT_CLINIC_OR_DEPARTMENT_OTHER)
Admission: EM | Admit: 2022-04-18 | Discharge: 2022-04-18 | Disposition: A | Discharge status: Home / Self Care | Payer: BC Managed Care – PPO | Attending: Emergency Medicine | Admitting: Emergency Medicine

## 2022-04-18 ENCOUNTER — Other Ambulatory Visit: Payer: Self-pay

## 2022-04-18 ENCOUNTER — Emergency Department (HOSPITAL_BASED_OUTPATIENT_CLINIC_OR_DEPARTMENT_OTHER): Payer: BC Managed Care – PPO

## 2022-04-18 DIAGNOSIS — O26891 Other specified pregnancy related conditions, first trimester: Secondary | ICD-10-CM | POA: Diagnosis not present

## 2022-04-18 DIAGNOSIS — J111 Influenza due to unidentified influenza virus with other respiratory manifestations: Secondary | ICD-10-CM

## 2022-04-18 DIAGNOSIS — R1030 Lower abdominal pain, unspecified: Secondary | ICD-10-CM | POA: Diagnosis not present

## 2022-04-18 DIAGNOSIS — E876 Hypokalemia: Secondary | ICD-10-CM | POA: Diagnosis not present

## 2022-04-18 DIAGNOSIS — O98511 Other viral diseases complicating pregnancy, first trimester: Secondary | ICD-10-CM | POA: Insufficient documentation

## 2022-04-18 DIAGNOSIS — J101 Influenza due to other identified influenza virus with other respiratory manifestations: Secondary | ICD-10-CM | POA: Diagnosis not present

## 2022-04-18 DIAGNOSIS — R000 Tachycardia, unspecified: Secondary | ICD-10-CM | POA: Insufficient documentation

## 2022-04-18 DIAGNOSIS — Z1152 Encounter for screening for COVID-19: Secondary | ICD-10-CM | POA: Insufficient documentation

## 2022-04-18 DIAGNOSIS — D696 Thrombocytopenia, unspecified: Secondary | ICD-10-CM | POA: Diagnosis not present

## 2022-04-18 DIAGNOSIS — O99111 Other diseases of the blood and blood-forming organs and certain disorders involving the immune mechanism complicating pregnancy, first trimester: Secondary | ICD-10-CM | POA: Insufficient documentation

## 2022-04-18 DIAGNOSIS — Z3A10 10 weeks gestation of pregnancy: Secondary | ICD-10-CM | POA: Insufficient documentation

## 2022-04-18 DIAGNOSIS — R1013 Epigastric pain: Secondary | ICD-10-CM | POA: Diagnosis not present

## 2022-04-18 DIAGNOSIS — O99281 Endocrine, nutritional and metabolic diseases complicating pregnancy, first trimester: Secondary | ICD-10-CM | POA: Diagnosis not present

## 2022-04-18 LAB — URINALYSIS, ROUTINE W REFLEX MICROSCOPIC
Bilirubin Urine: NEGATIVE
Glucose, UA: NEGATIVE mg/dL
Hgb urine dipstick: NEGATIVE
Ketones, ur: NEGATIVE mg/dL
Leukocytes,Ua: NEGATIVE
Nitrite: NEGATIVE
Protein, ur: 30 mg/dL — AB
Specific Gravity, Urine: >=1.03 (ref 1.005–1.030)
pH: 5.5 (ref 5.0–8.0)

## 2022-04-18 LAB — CBC WITH DIFFERENTIAL/PLATELET
Abs Immature Granulocytes: 0.04 10*3/uL (ref 0.00–0.07)
Basophils Absolute: 0 10*3/uL (ref 0.0–0.1)
Basophils Relative: 0 %
Eosinophils Absolute: 0 10*3/uL (ref 0.0–0.5)
Eosinophils Relative: 0 %
HCT: 34.4 % — ABNORMAL LOW (ref 36.0–46.0)
Hemoglobin: 12 g/dL (ref 12.0–15.0)
Immature Granulocytes: 1 %
Lymphocytes Relative: 5 %
Lymphs Abs: 0.3 10*3/uL — ABNORMAL LOW (ref 0.7–4.0)
MCH: 30.2 pg (ref 26.0–34.0)
MCHC: 34.9 g/dL (ref 30.0–36.0)
MCV: 86.4 fL (ref 80.0–100.0)
Monocytes Absolute: 0.4 10*3/uL (ref 0.1–1.0)
Monocytes Relative: 7 %
Neutro Abs: 4.3 10*3/uL (ref 1.7–7.7)
Neutrophils Relative %: 87 %
Platelets: 105 10*3/uL — ABNORMAL LOW (ref 150–400)
RBC: 3.98 MIL/uL (ref 3.87–5.11)
RDW: 12.1 % (ref 11.5–15.5)
WBC: 4.9 10*3/uL (ref 4.0–10.5)
nRBC: 0 % (ref 0.0–0.2)

## 2022-04-18 LAB — COMPREHENSIVE METABOLIC PANEL
ALT: 33 U/L (ref 0–44)
AST: 33 U/L (ref 15–41)
Albumin: 4 g/dL (ref 3.5–5.0)
Alkaline Phosphatase: 75 U/L (ref 38–126)
Anion gap: 8 (ref 5–15)
BUN: 10 mg/dL (ref 6–20)
CO2: 20 mmol/L — ABNORMAL LOW (ref 22–32)
Calcium: 9 mg/dL (ref 8.9–10.3)
Chloride: 104 mmol/L (ref 98–111)
Creatinine, Ser: 0.63 mg/dL (ref 0.44–1.00)
GFR, Estimated: >60 mL/min (ref >60)
Glucose, Bld: 128 mg/dL — ABNORMAL HIGH (ref 70–99)
Potassium: 3.4 mmol/L — ABNORMAL LOW (ref 3.5–5.1)
Sodium: 132 mmol/L — ABNORMAL LOW (ref 135–145)
Total Bilirubin: 0.2 mg/dL — ABNORMAL LOW (ref 0.3–1.2)
Total Protein: 6.9 g/dL (ref 6.5–8.1)

## 2022-04-18 LAB — RESP PANEL BY RT-PCR (RSV, FLU A&B, COVID)  RVPGX2
Influenza A by PCR: POSITIVE — AB
Influenza B by PCR: NEGATIVE
Resp Syncytial Virus by PCR: NEGATIVE
SARS Coronavirus 2 by RT PCR: NEGATIVE

## 2022-04-18 LAB — HCG, QUANTITATIVE, PREGNANCY: hCG, Beta Chain, Quant, S: 108534 m[IU]/mL — ABNORMAL HIGH (ref <5)

## 2022-04-18 LAB — URINALYSIS, MICROSCOPIC (REFLEX)

## 2022-04-18 LAB — LIPASE, BLOOD: Lipase: 27 U/L (ref 11–51)

## 2022-04-18 MED ORDER — LACTATED RINGERS IV BOLUS
1000.0000 mL | Freq: Once | INTRAVENOUS | Status: AC
  Administered 2022-04-18: 1000 mL via INTRAVENOUS

## 2022-04-18 MED ORDER — ACETAMINOPHEN 500 MG PO TABS
1000.0000 mg | ORAL_TABLET | Freq: Once | ORAL | Status: AC
  Administered 2022-04-18: 1000 mg via ORAL
  Filled 2022-04-18: qty 2

## 2022-04-18 MED ORDER — POTASSIUM CHLORIDE CRYS ER 20 MEQ PO TBCR
20.0000 meq | EXTENDED_RELEASE_TABLET | Freq: Once | ORAL | Status: AC
  Administered 2022-04-18: 20 meq via ORAL
  Filled 2022-04-18: qty 1

## 2022-04-18 MED ORDER — OSELTAMIVIR PHOSPHATE 75 MG PO CAPS: 75.0000 mg | ORAL_CAPSULE | Freq: Two times a day (BID) | ORAL | 0 refills | Status: AC

## 2022-04-18 MED ORDER — DOXYLAMINE-PYRIDOXINE 10-10 MG PO TBEC: 1.0000 | DELAYED_RELEASE_TABLET | Freq: Two times a day (BID) | ORAL | 0 refills | Status: AC | PRN

## 2022-04-18 NOTE — ED Triage Notes (Signed)
Recurrent abdominal pain , LUQ , nausea , no emesis , migraine headache . [redacted] weeks pregnant .

## 2022-04-18 NOTE — ED Notes (Signed)
Abd pain since Saturday, some constipation, vomited x 1, child sick at home as well, poor appetite. 10 weeks preg (G2P1), also has HA.

## 2022-04-18 NOTE — Discharge Instructions (Addendum)
You were seen in there ER today for evaluation of your cough and cold symptoms.  You tested positive for influenza A.  I am glad you are feeling better after the Tylenol and fluids.  Please make sure you are taking Tylenol 1000 mg every 6 hours as needed for fever, headache, and bodyaches.  Please make sure you are staying well-hydrated using plenty of fluids, mainly water.  I am sending you home on 2 medications.  One of the medications and antiviral medication for the flu called Tamiflu.  Please take as directed.  This is recommended in pregnancies to help prevent adverse side effects from the flu.  Additionally, sending home with some Diclegis which will help with your nausea.  You take that up to twice daily as needed.  If your insurance does not cover this, you can pick up some Unisom over the counter. Additionally, you had some mild protein in urine which likely thing is from your dehydration however I would mention this to your next OB/GYN appointment.  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  I included information for hematologist into the discharge paperwork.  You can call them to schedule appointment as needed.  If any concerns, new or worsening symptoms, please turn to the nearest Emergency Department for reevaluation. Contact a health care provider if: You have a fever or chills. You have a cough, sore throat, or stuffy nose. You have worsening or unusual muscle aches, headache, tiredness, or loss of appetite. You have vomiting or diarrhea. Get help right away if: You have trouble breathing. You have chest pain. You have abdominal pain. You begin to have labor pains. You do not feel your baby move. You have diarrhea or vomiting that will not go away. You have dizziness or confusion. Your symptoms do not improve, even with treatment. These symptoms may represent a serious problem that is an emergency. Do not wait to see if the symptoms  will go away. Get medical help right away. Call your local emergency services (911 in the [place] Do not drive yourself to the hospital.

## 2022-04-18 NOTE — ED Provider Notes (Addendum)
[place] EMERGENCY DEPARTMENT AT [facility] Provider Note   CSN: [id number] Arrival date & time: 04/18/22  0865     History  Chief Complaint  Patient presents with   Abdominal Pain    Sheila Mcclain is a 37 y.o. female G5P2 [redacted] weeks pregnant with h/o diastases recti, kidney stones, intussusception presents the emergency room today for evaluation of cough and cold symptoms with a headache since yesterday.  She reports that she has been having some epigastric and suprapubic pain but this is concurrent with her pain that she says is the beginning of pregnancy which she links to her diastases recti.  She has not had any vaginal discharge, vaginal bleeding, dysuria, hematuria.  She reports that she is having some urinary frequency but she has had that throughout pregnancy.  She reports that she started to have a cough and cold symptoms yesterday.  Her daughter was recently sick last week.  She is having some nausea with 1 episode of vomiting but is mainly dry heaving.  She has had nausea throughout her pregnancy as well.  She had some constipation.  Reports a cough for the past 3 days with a runny nose, but mainly the body aches with a runny nose started yesterday.  No chest pain or shortness of breath.  Denies any neck stiffness.  She reports that she has a headache that feels like a typical headache for her.  She does not know if she had a fever as she has not been taking her temperature.  She has not taking medications for her symptoms.  She takes a daily prenatal vitamin.  She started clindamycin.  Denies any tobacco, EtOH, also drug use ever.   Abdominal Pain Associated symptoms: constipation, cough, fatigue, nausea and vomiting   Associated symptoms: no chest pain, no chills, no diarrhea, no dysuria, no fever, no hematuria, no shortness of breath, no sore throat, no vaginal bleeding and no vaginal discharge        Home Medications Prior to Admission medications    Medication Sig Start Date End Date Taking? Authorizing Provider  tinidazole (TINDAMAX) 500 MG tablet Take 1,000 mg by mouth daily. 5 day supply Patient not taking: Reported on 11/20/2020 11/11/20   [provider]      Allergies    Clindamycin    Review of Systems   Review of Systems  Constitutional:  Positive for appetite change and fatigue. Negative for chills and fever.  HENT:  Positive for congestion and rhinorrhea. Negative for sore throat.   Respiratory:  Positive for cough. Negative for shortness of breath.   Cardiovascular:  Negative for chest pain.  Gastrointestinal:  Positive for abdominal pain, constipation, nausea and vomiting. Negative for diarrhea.  Genitourinary:  Positive for frequency. Negative for dysuria, hematuria, vaginal bleeding, vaginal discharge and vaginal pain.  Musculoskeletal:  Positive for myalgias. Negative for neck pain.  Neurological:  Positive for headaches. Negative for syncope, light-headedness and numbness.    Physical Exam Updated Vital Signs BP 103/67   Pulse (!) 101   Temp 98.8 F (37.1 C) (Oral)   Resp 18   Wt 69.9 kg   LMP 01/23/2022   SpO2 96%   BMI 23.42 kg/m  Physical Exam Vitals and nursing note reviewed.  Constitutional:      Appearance: Normal appearance. She is not toxic-appearing.     Comments: Uncomfortable, but nontoxic-appearing  HENT:     Head: Normocephalic and atraumatic.     Mouth/Throat:  Mouth: Mucous membranes are moist.  Eyes:     General: No scleral icterus. Cardiovascular:     Rate and Rhythm: Regular rhythm. Tachycardia present.     Comments: Mild tachycardia Pulmonary:     Effort: Pulmonary effort is normal. No respiratory distress.     Breath sounds: Normal breath sounds.     Comments: Lung sounds are clear to auscultation bilaterally.  She speaking in full sentences with ease and is satting well on room air without any increased work of breathing. Abdominal:     General: Abdomen is flat.  Bowel sounds are normal.     Palpations: Abdomen is soft.     Tenderness: There is abdominal tenderness in the epigastric area and suprapubic area. There is no right CVA tenderness, left CVA tenderness, guarding or rebound. Negative signs include Murphy's sign.  Genitourinary:    Comments: Patient declined Musculoskeletal:        General: No deformity.     Cervical back: Normal range of motion.  Skin:    General: Skin is warm and dry.  Neurological:     General: No focal deficit present.     Mental Status: She is alert. Mental status is at baseline.     GCS: GCS eye subscore is 4. GCS verbal subscore is 5. GCS motor subscore is 6.     Cranial Nerves: No cranial nerve deficit, dysarthria or facial asymmetry.     Sensory: No sensory deficit.     Motor: No weakness.     ED Results / Procedures / Treatments   Labs (all labs ordered are listed, but only abnormal results are displayed) Labs Reviewed  RESP PANEL BY RT-PCR (RSV, FLU A&B, COVID)  RVPGX2 - Abnormal; Notable for the following components:      Result Value   Influenza A by PCR POSITIVE (*)    All other components within normal limits  CBC WITH DIFFERENTIAL/PLATELET - Abnormal; Notable for the following components:   HCT 34.4 (*)    Platelets 105 (*)    Lymphs Abs 0.3 (*)    All other components within normal limits  HCG, QUANTITATIVE, PREGNANCY - Abnormal; Notable for the following components:   hCG, Beta Chain, Quant, S 108,534 (*)    All other components within normal limits  COMPREHENSIVE METABOLIC PANEL - Abnormal; Notable for the following components:   Sodium 132 (*)    Potassium 3.4 (*)    CO2 20 (*)    Glucose, Bld 128 (*)    Total Bilirubin 0.2 (*)    All other components within normal limits  URINALYSIS, ROUTINE W REFLEX MICROSCOPIC - Abnormal; Notable for the following components:   Protein, ur 30 (*)    All other components within normal limits  URINALYSIS, MICROSCOPIC (REFLEX) - Abnormal; Notable for  the following components:   Bacteria, UA FEW (*)    All other components within normal limits  URINE CULTURE  LIPASE, BLOOD    EKG None  Radiology US OB Comp < 14 Wks  Result Date: 04/18/2022 CLINICAL DATA:  Abdominal pain in pregnancy EXAM: OBSTETRIC <14 WK ULTRASOUND TECHNIQUE: Transabdominal ultrasound was performed for evaluation of the gestation as well as the maternal uterus and adnexal regions. COMPARISON:  Obstetric ultrasound dated 03/20/2022 FINDINGS: Intrauterine gestational sac: Single Yolk sac:  Visualized. Embryo:  Visualized. Cardiac Activity: Visualized. Heart Rate: 171 bpm MSD: N/A CRL:   60.0 mm   12 w 3 d  [place] EDC: 10/28/2022 Subchorionic hemorrhage: Small hypoechoic focus along the posteroinferior subchorionic region may reflect a small subchorionic hemorrhage. Maternal uterus/adnexae: No adnexal masses.  Ovaries are not seen. IMPRESSION: 1. Viable intrauterine pregnancy at 12 weeks 3 days by crown-rump length with appropriate interval growth. 2. Possible small subchorionic hemorrhage. Electronically Signed   By: Agustin Cree M.D.   On: 04/18/2022 11:23    Procedures Procedures   Medications Ordered in ED Medications  lactated ringers bolus 1,000 mL (0 mLs Intravenous Stopped 04/18/22 1041)  acetaminophen (TYLENOL) tablet 1,000 mg (1,000 mg Oral Given 04/18/22 1002)    ED Course/ Medical Decision Making/ A&P                            Medical Decision Making Amount and/or Complexity of Data Reviewed Labs: ordered. Radiology: ordered.  Risk OTC drugs. Prescription drug management.   37 year old female presents emergency room today for evaluation of cough and cold symptoms with some upper and lower abdominal pain reports this has been her chronic abdominal pain in pregnancy.  Differential diagnosis includes was limited to COVID, flu, RSV, viral illness, electrolyte abnormality, dehydration, gastritis, UTI, diastases recti, intrauterine etiology,  threatened abortion, abdominal pain in pregnancy.  Vital signs initially showed patient's blood pressure normal with an slightly elevated temperature at 100.4 mildly tachycardic at 101, satting well on room air without increased work of breathing.  The patient was concerned as she has had 3 previous miscarriages around this timeframe and was concerned given that she still having the upper and lower abdominal pain and is requesting an ultrasound.  I think this is reasonable.  I independently reviewed and interpreted the patient's labs.  CMP shows mild decrease sodium 132, mildly decreased potassium 3.4.  Elevated glucose at 128 however bicarb is mildly decreased at 20 with a normal gap however.  No LFT or other electrolyte abnormalities.  Lipase within normal limits.  CBC without cytosis or anemia.  It appears her platelets are at 105 which is consistent with her history of thrombocytopenia.  It appears they were as low as 116 last year.  Urinalysis does show some protein in the urine, however her concentration is elevated, could be in the setting of dehydration.  Will have her follow-up with her OB/GYN on this for retest.  It does show few bacteria with 6-10 squames but no white blood cells or nitrates or leukocytes.  She is not having any dysuria symptoms.  I will send her urine out for culture given that she is pregnant however given her chronic epigastric suprapubic tenderness that is unchanged as well as no other urinary symptoms, do not think any empiric treatment is needed at this time.  Lipase is normal.  Respiratory panel is positive for flu A.  US shows IMPRESSION: 1. Viable intrauterine pregnancy at 12 weeks 3 days by crown-rump length with appropriate interval growth. 2. Possible small subchorionic hemorrhage.   Patient's hCG has down trended significantly from 227,000 4 weeks prior to 108,000 now. I consulted OBGYN and spoke with Dr. Adrian Blackwater, he reports that this can be typical in the 10-12w  gestation as the placenta is growing and becoming more attached. Given the patient's [place] results, this should not be concerning. I appreciated his consultation.   After Tylenol and fluids, patient tachycardia has improved with a rate of 94 however her fever is gone down to 98.8.  All other vitals still stable.  Patient reports that she  is feeling much better and her headache and bodyaches have improved significantly.  I reviewed the labs and imaging with the patient and partner at bedside.  We discussed the ultrasound as well as her lab findings for flu, mildly decreased potassium, and her thrombocytopenia.  She is asking more about her thrombocytopenia she has been told this before.  She would like additional follow-up on this which I think is reasonable.  Will send her to Dr. Marin Olp with hematology as he is on-call today.  Additionally, her potassium was replenished today as is mildly decreased.  She has been able to tolerate p.o. while here.  Considered admission however patient is tolerating p.o., tachycardia and fever have improved as well as the patient's feeling better.  I think she stable for discharge home with close OB follow-up.  We discussed return precautions and red flag symptoms.  Discussed do not take ibuprofen as this is dangerous in the first trimester pregnancy.  The patient verbalized understanding agrees the plan.  Patient is stable being discharged home in good condition.  I discussed this case with my attending physician who cosigned this note including patient's presenting symptoms, physical exam, and planned diagnostics and interventions. Attending physician stated agreement with plan or made changes to plan which were implemented.  Final Clinical Impression(s) / ED Diagnoses Final diagnoses:  Flu  Thrombocytopenia ([place])  Abdominal pain during pregnancy in first trimester  Hypokalemia    Rx / DC Orders ED Discharge Orders          Ordered    oseltamivir (TAMIFLU) 75 MG  capsule  Every 12 hours        04/18/22 1227    Doxylamine-Pyridoxine (DICLEGIS) 10-10 MG TBEC  2 times daily PRN        04/18/22 1227              Sherrell Puller, PA-C 04/18/22 1232    Sherrell Puller, PA-C 04/18/22 Leakey, Whitney, DO 04/18/22 1240

## 2022-04-19 LAB — URINE CULTURE: Culture: NO GROWTH

## 2022-07-23 IMAGING — CT CT ABD-PELV W/ CM
2 of 4 series · 15 of 46 positions shown, 17 images · IV contrast (Omnipaque)
Comparison: None.

CLINICAL DATA: Epigastric pain. Chronic back pain. feels like the
pain may be related to a kidney stone

EXAM:
CT ABDOMEN AND PELVIS WITH CONTRAST
TECHNIQUE: Multidetector CT imaging of the abdomen and pelvis was performed
using the standard protocol following bolus administration of
intravenous contrast.
CONTRAST:  85mL OMNIPAQUE IOHEXOL 350 MG/ML SOLN

[Series 2: axial st · axial · 0.92mm/px · z∈[-668,-232]mm · 12 of 95 slices shown, 14 images]
[im 4/95  soft-tissue]
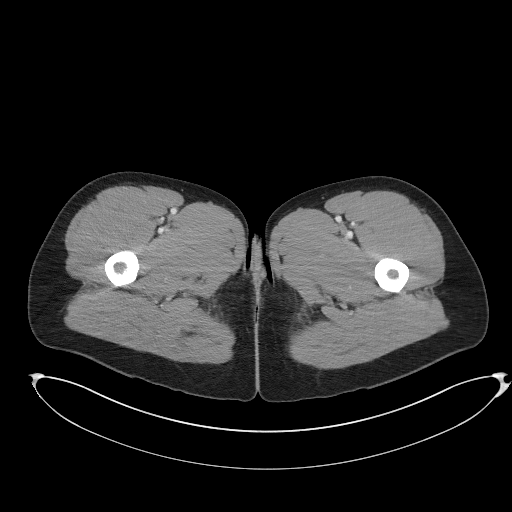
[im 4/95  bone]
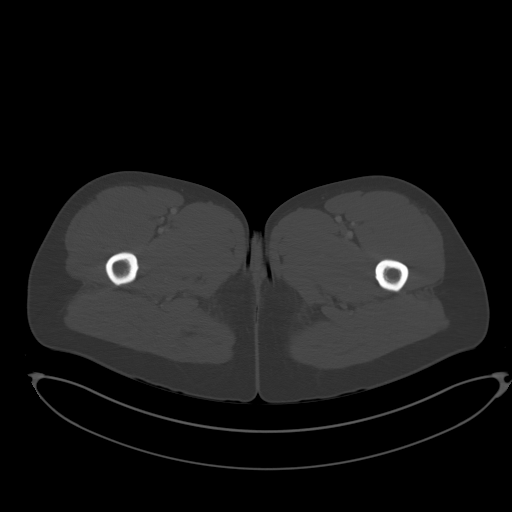
[im 12/95  soft-tissue]
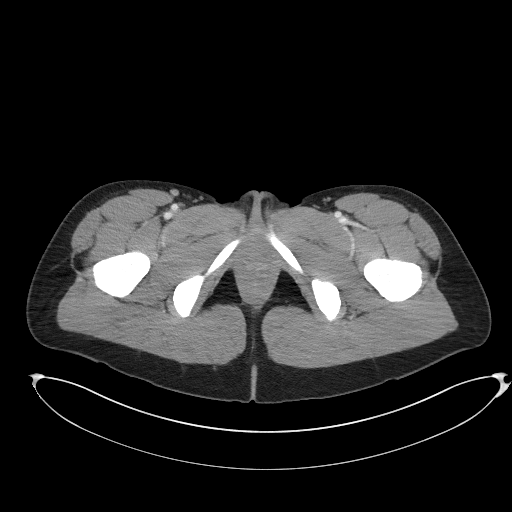
[im 20/95  soft-tissue]
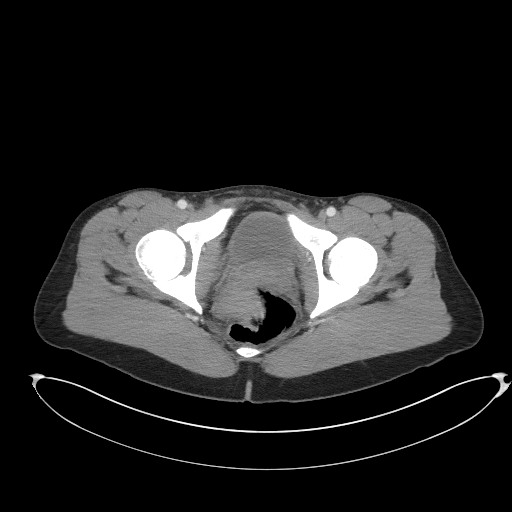
[im 28/95  soft-tissue]
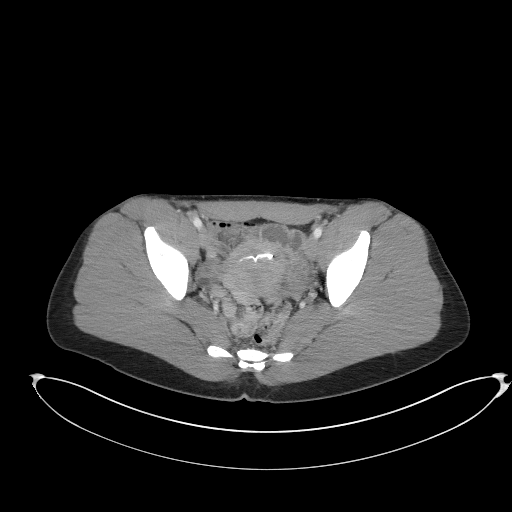
[im 36/95  soft-tissue]
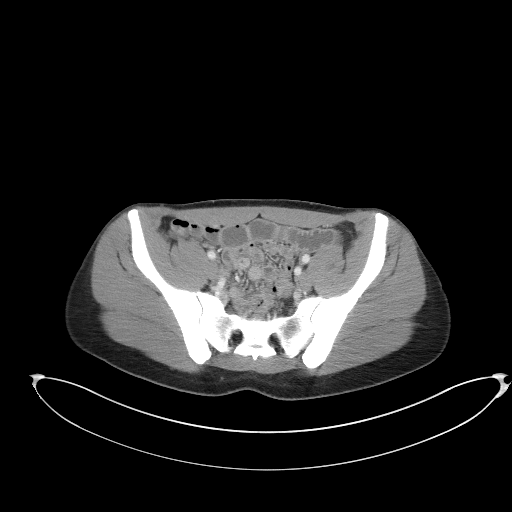
[im 44/95  soft-tissue]
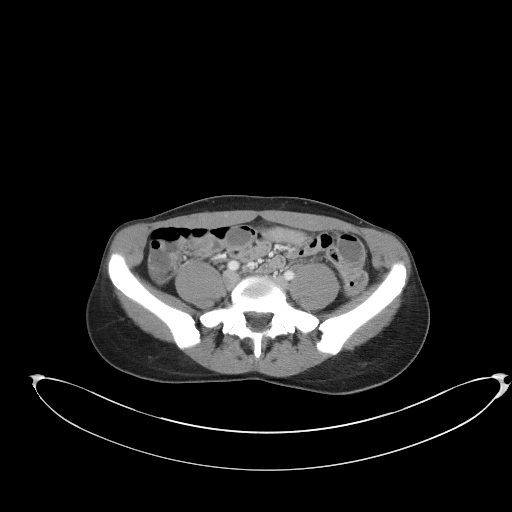
[im 51/95  soft-tissue]
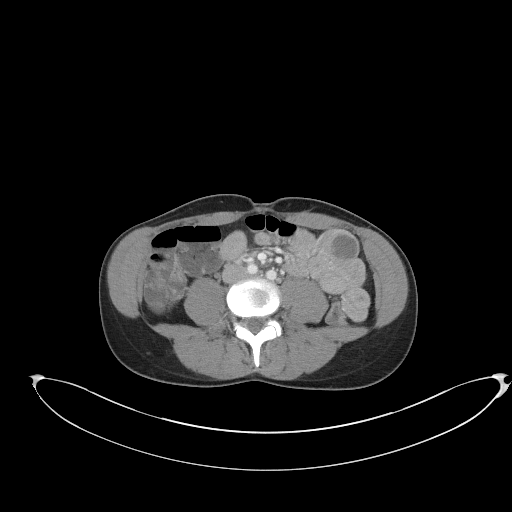
[im 59/95  soft-tissue]
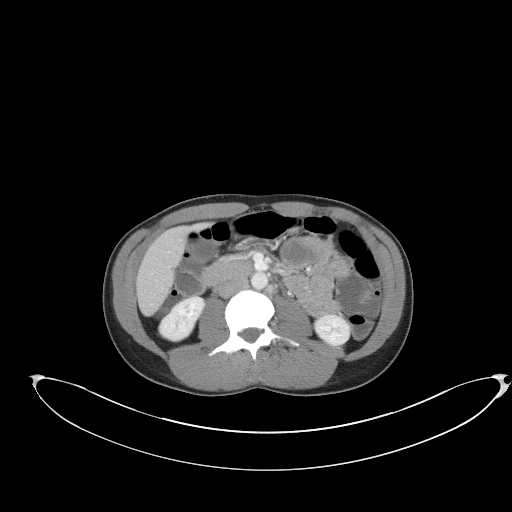
[im 67/95  soft-tissue]
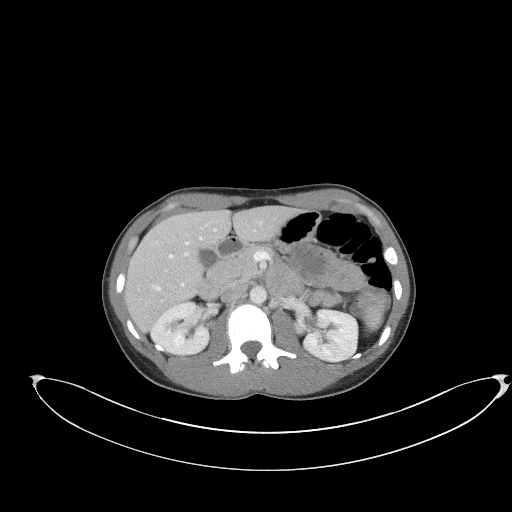
[im 67/95  bone]
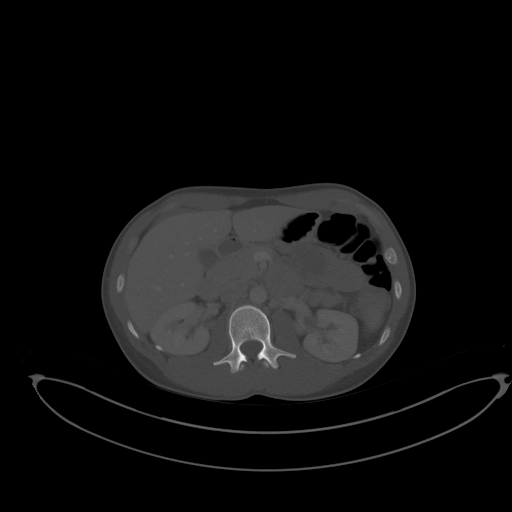
[im 75/95  soft-tissue]
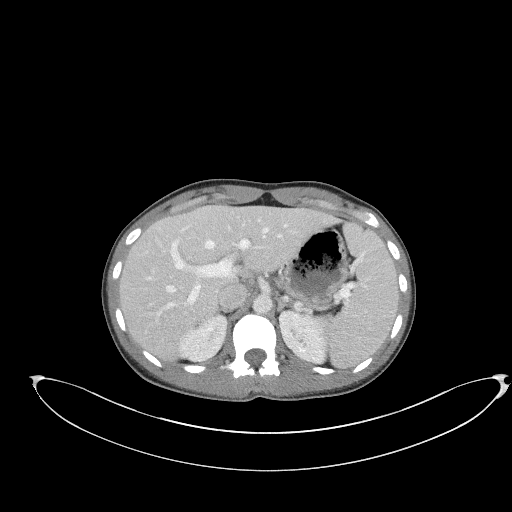
[im 83/95  soft-tissue]
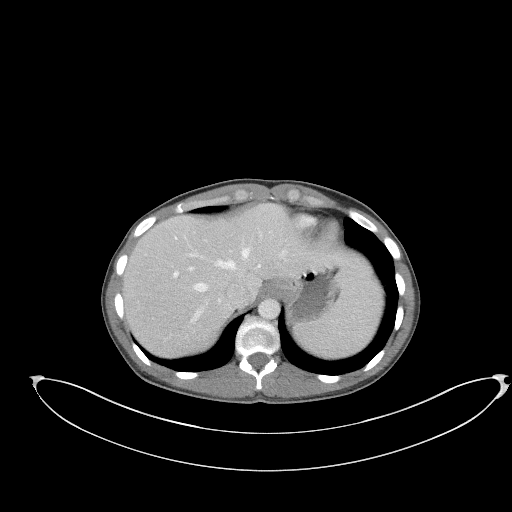
[im 91/95  soft-tissue]
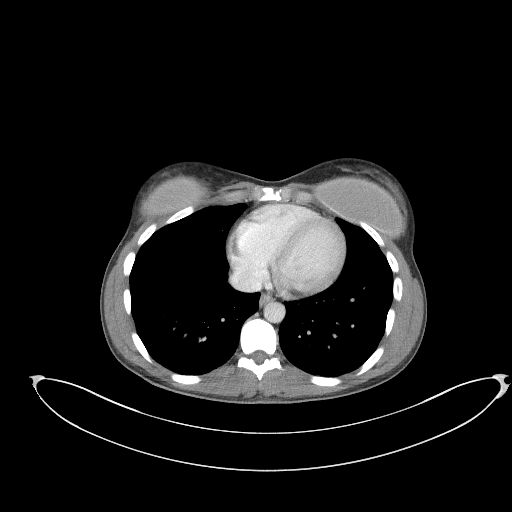

[Series 5: coronal st · coronal · 0.70mm/px · 3 of 99 slices shown]
[im 33/99  soft-tissue]
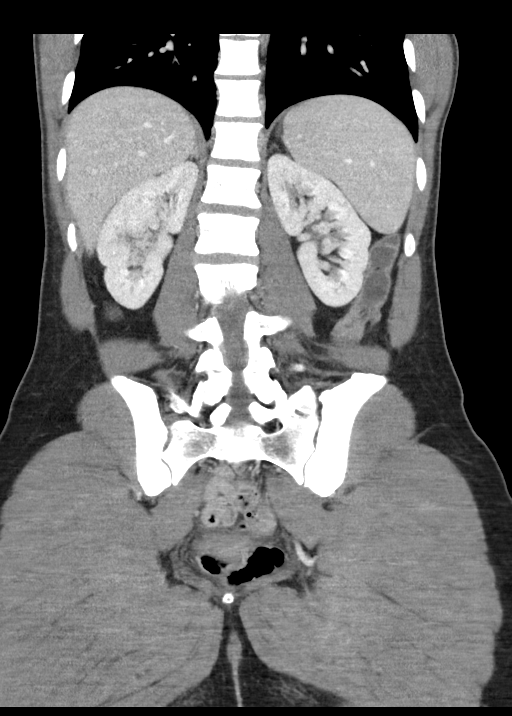
[im 44/99  soft-tissue]
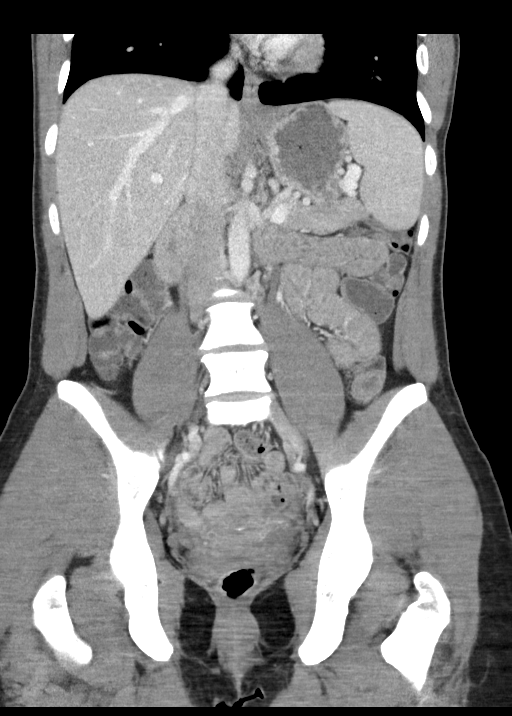
[im 55/99  soft-tissue]
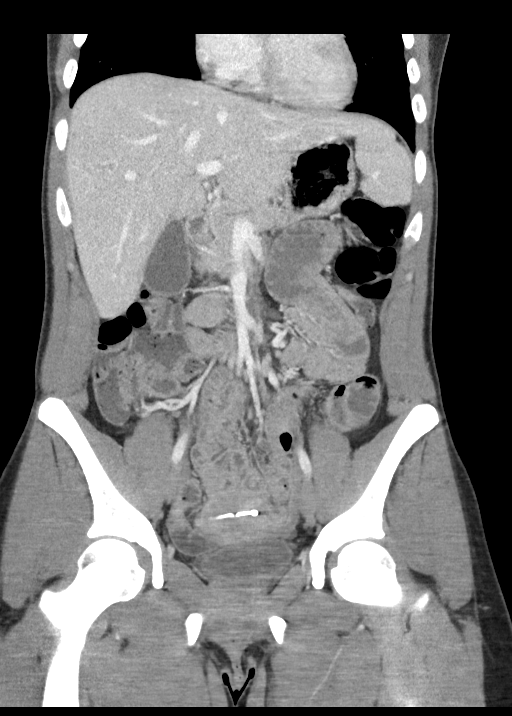

[15 of 46 positions shown; findings below may reference images not displayed]

FINDINGS: Lower chest: Bilateral breast implants partially visualized. No
acute abnormality.

Hepatobiliary: No focal liver abnormality. No gallstones,
gallbladder wall thickening, or pericholecystic fluid. No biliary
dilatation.

Pancreas: No focal lesion. Normal pancreatic contour. No surrounding
inflammatory changes. No main pancreatic ductal dilatation.

Spleen: Normal in size without focal abnormality.

Adrenals/Urinary Tract:

No adrenal nodule bilaterally.

Bilateral kidneys enhance symmetrically. Subcentimeter hypodensity
within the right kidney is too small to characterize. No
nephroureterolithiasis.

No nephroureterolithiasis.  No hydronephrosis. No hydroureter.

The urinary bladder is unremarkable.

Stomach/Bowel: Stomach is within normal limits. Left upper abdomen
intussusception of the proximal small bowel telescoping into the
small bowel and measuring approximately 5.5 cm in length ([DATE],
[DATE]). Otherwise no small bowel wall thickening or dilatation. No
evidence of large bowel wall thickening or dilatation. The appendix
not definitely identified with no right lower quadrant inflammatory
changes to suggest acute appendicitis.

Vascular/Lymphatic: No abdominal aorta or iliac aneurysm. No
abdominal, pelvic, or inguinal lymphadenopathy.

Reproductive: A t shaped intrauterine device is noted within the
uterus in grossly appropriate position. Otherwise the uterus and
bilateral adnexal regions are unremarkable.

Other: No intraperitoneal free fluid. No intraperitoneal free gas.
No organized fluid collection.

Musculoskeletal:

No abdominal wall hernia or abnormality.

No suspicious lytic or blastic osseous lesions. No acute displaced
fracture. Endplate sclerosis L4-L5.
IMPRESSION: 1. Left upper abdomen proximal small bowel into small bowel
intussusception measuring approximately 5.5 cm in length.
2. T-shaped intrauterine device in grossly appropriate position.
3. No nephroureterolithiasis.

## 2022-07-24 IMAGING — CT CT ABD-PELV W/O CM
2 of 4 series · 15 of 46 positions shown, 17 images · non-contrast
Comparison: Abdominopelvic CT 11/19/2020.

CLINICAL DATA: Constant lower abdominal and back pain with vomiting
since yesterday. Intussusception suspected.

EXAM:
CT ABDOMEN AND PELVIS WITHOUT CONTRAST
TECHNIQUE: Multidetector CT imaging of the abdomen and pelvis was performed
following the standard protocol without IV contrast.

[Series 2: axial st · axial · 0.73mm/px · z∈[-547,-122]mm · 12 of 97 slices shown, 14 images]
[im 6/97  soft-tissue]
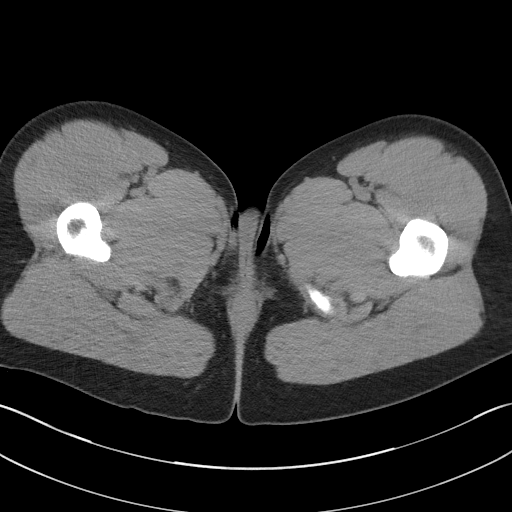
[im 6/97  bone]
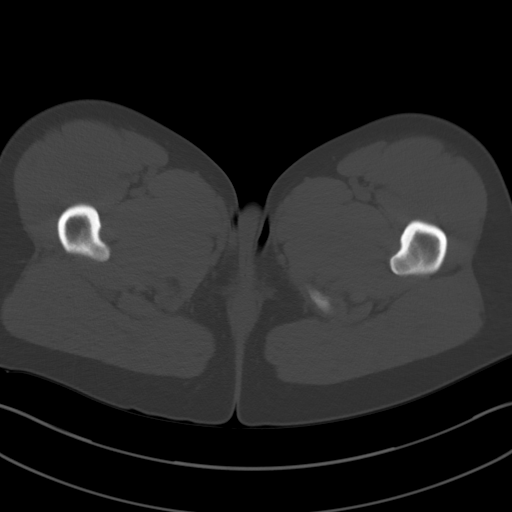
[im 16/97  soft-tissue]
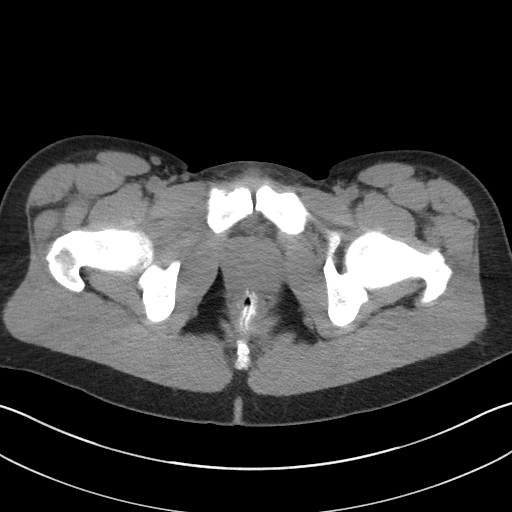
[im 21/97  soft-tissue]
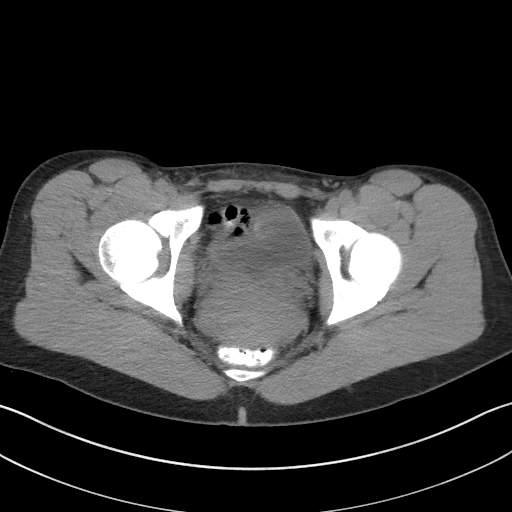
[im 31/97  soft-tissue]
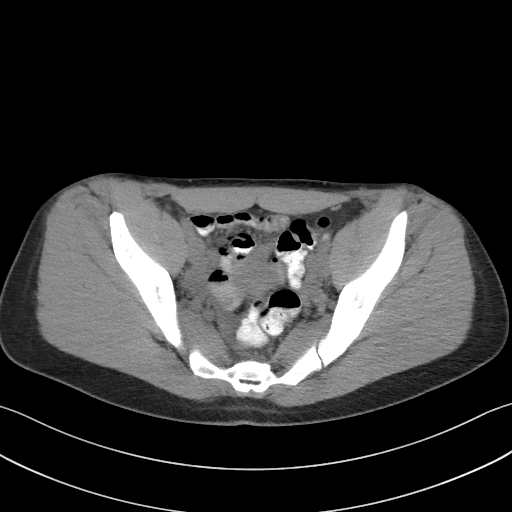
[im 36/97  soft-tissue]
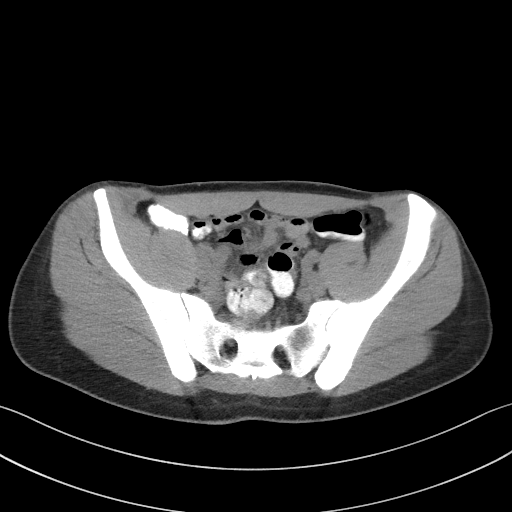
[im 46/97  soft-tissue]
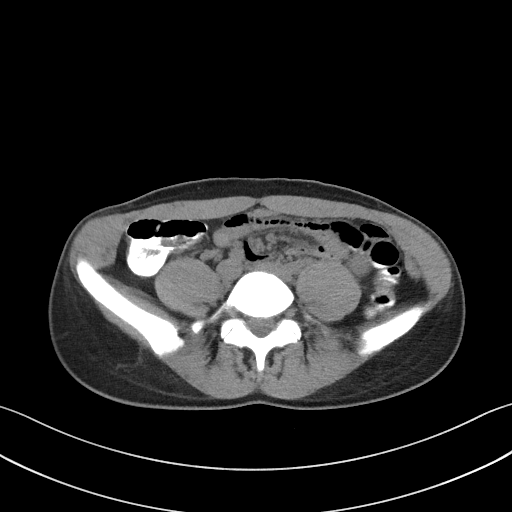
[im 51/97  soft-tissue]
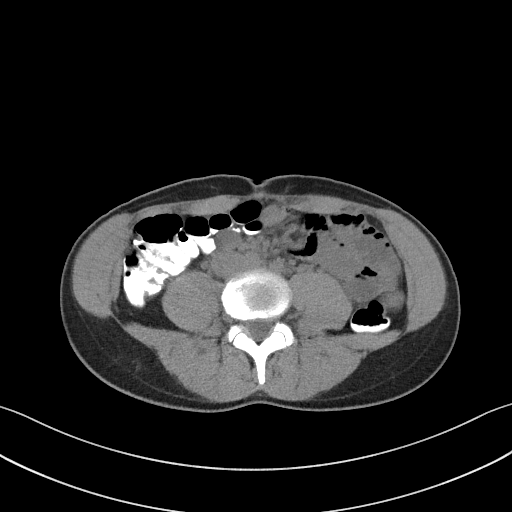
[im 61/97  soft-tissue]
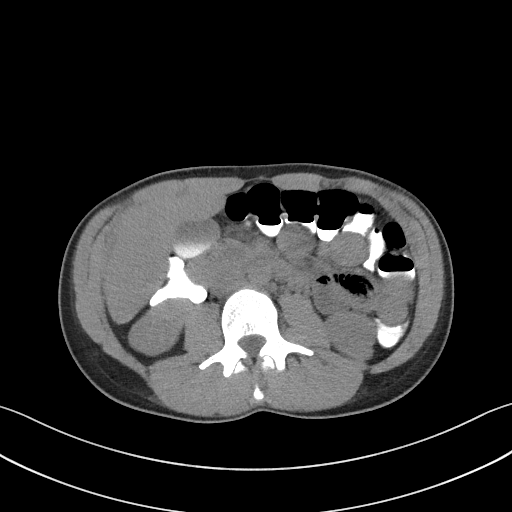
[im 66/97  soft-tissue]
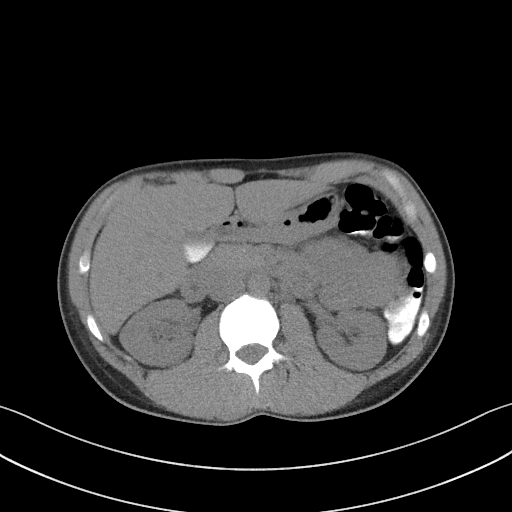
[im 66/97  bone]
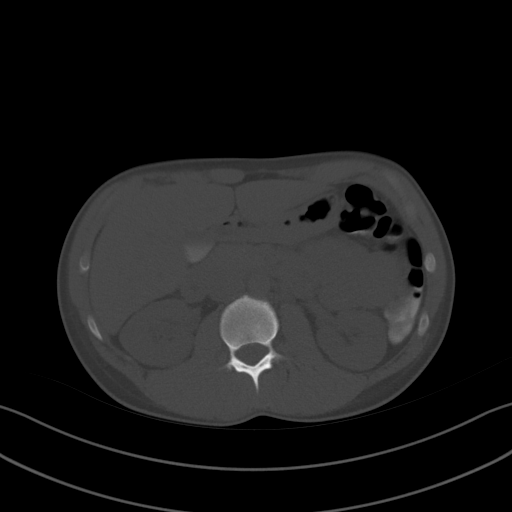
[im 76/97  soft-tissue]
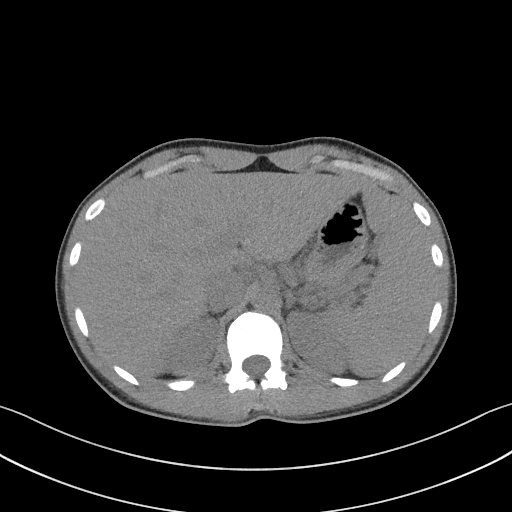
[im 81/97  soft-tissue]
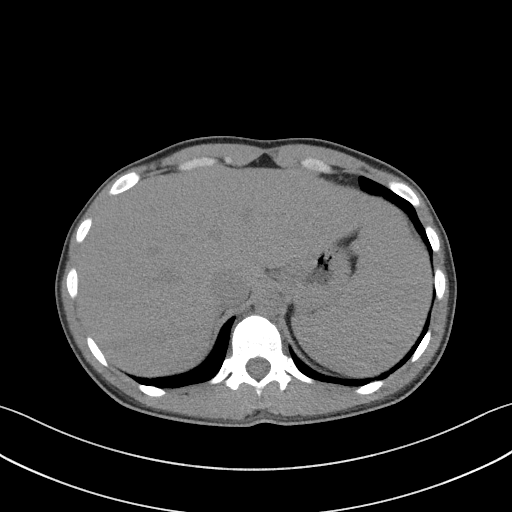
[im 91/97  soft-tissue]
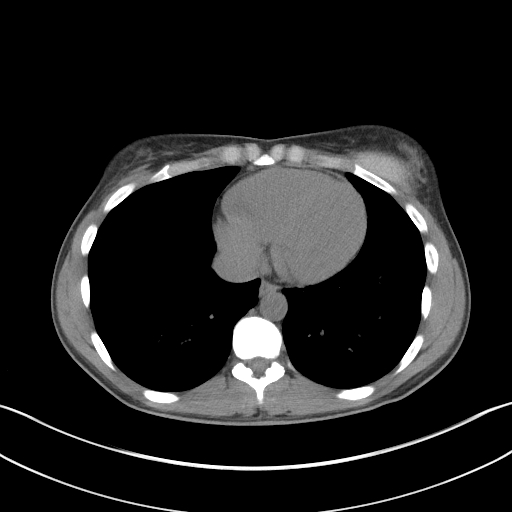

[Series 4: coronal st · coronal · 0.66mm/px · 3 of 77 slices shown]
[im 26/77  soft-tissue]
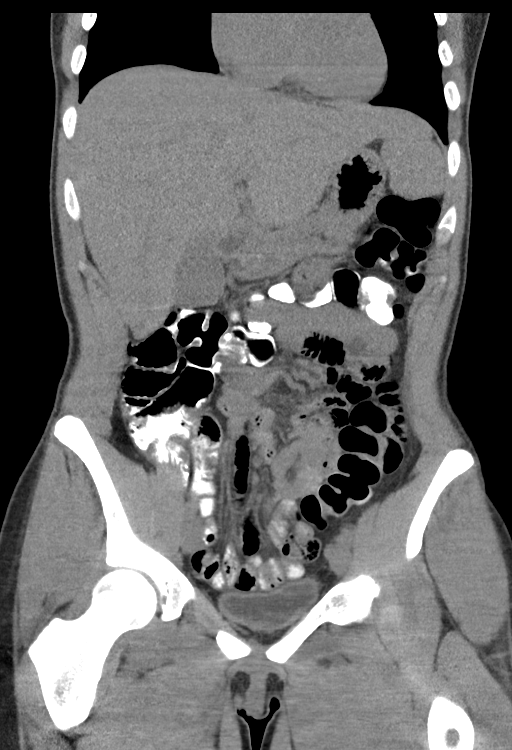
[im 34/77  soft-tissue]
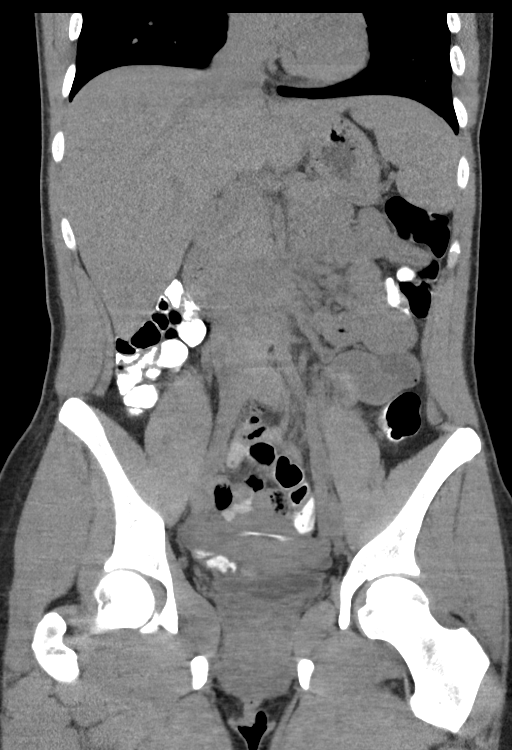
[im 43/77  soft-tissue]
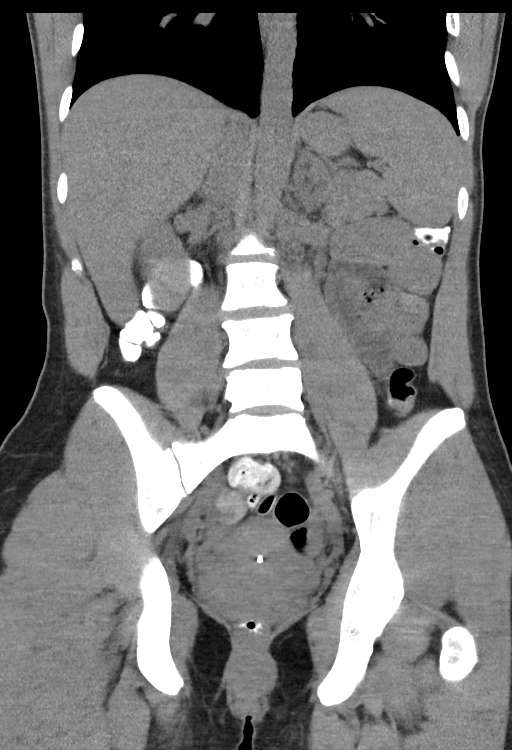

[15 of 46 positions shown; findings below may reference images not displayed]

FINDINGS: Lower chest: Clear lung bases. No significant pleural or pericardial
effusion. Bilateral breast implants are partially imaged.

Hepatobiliary: The liver appears unremarkable as imaged in the
noncontrast state. There is dependent high density within the
gallbladder lumen, new from recent CT and attributed to vicarious
excretion of contrast. No evidence of gallbladder wall thickening or
biliary dilatation.

Pancreas: Unremarkable. No pancreatic ductal dilatation or
surrounding inflammatory changes.

Spleen: Normal in size without focal abnormality.

Adrenals/Urinary Tract: Both adrenal glands appear normal. The
kidneys appear normal without evidence of urinary tract calculus,
suspicious lesion or hydronephrosis. No bladder abnormalities are
seen.

Stomach/Bowel: Enteric contrast for this examination has passed into
the colon. There is no contrast within the stomach or small bowel.
The previously demonstrated small bowel intussusception in the left
mid abdomen is no longer clearly seen, although assessment is
limited by the lack of small bowel enteric contrast and intravenous
contrast. No evidence of bowel wall thickening, distention or
surrounding inflammation. The appendix is not clearly visualized.

Vascular/Lymphatic: There are no enlarged abdominal or pelvic lymph
nodes. No significant vascular findings on noncontrast imaging.

Reproductive: Intrauterine device in place.  No adnexal mass.

Other: No extravasated enteric contrast, free air or focal
extraluminal fluid collection.

Musculoskeletal: No acute or significant osseous findings.
Asymmetric endplate degenerative changes on the right at L4-5.
IMPRESSION: 1. No acute abdominal findings identified.
2. There is no evidence of bowel obstruction or perforation. The
previously demonstrated small bowel intussusception is not clearly
visualized, although assessment is limited by the lack of
intravenous and small-bowel enteric contrast on this study. This may
have represented incidental transient intussusception.
